# Patient Record
Sex: Male | Born: 1935
Health system: Southern US, Community
[De-identification: ages and names within clinical notes are randomized; demographics above are authoritative.]

## PROBLEM LIST (undated history)

## (undated) DIAGNOSIS — H3530 Unspecified macular degeneration: Secondary | ICD-10-CM

## (undated) DIAGNOSIS — E782 Mixed hyperlipidemia: Secondary | ICD-10-CM

## (undated) DIAGNOSIS — Q678 Other congenital deformities of chest: Secondary | ICD-10-CM

## (undated) DIAGNOSIS — I959 Hypotension, unspecified: Secondary | ICD-10-CM

## (undated) DIAGNOSIS — S0001XA Abrasion of scalp, initial encounter: Secondary | ICD-10-CM

## (undated) DIAGNOSIS — Z8701 Personal history of pneumonia (recurrent): Secondary | ICD-10-CM

## (undated) DIAGNOSIS — E039 Hypothyroidism, unspecified: Secondary | ICD-10-CM

## (undated) DIAGNOSIS — R413 Other amnesia: Secondary | ICD-10-CM

## (undated) DIAGNOSIS — F0390 Unspecified dementia without behavioral disturbance: Secondary | ICD-10-CM

## (undated) DIAGNOSIS — N400 Enlarged prostate without lower urinary tract symptoms: Secondary | ICD-10-CM

## (undated) DIAGNOSIS — R400 Somnolence: Secondary | ICD-10-CM

## (undated) DIAGNOSIS — E079 Disorder of thyroid, unspecified: Secondary | ICD-10-CM

## (undated) DIAGNOSIS — M40203 Unspecified kyphosis, cervicothoracic region: Secondary | ICD-10-CM

## (undated) DIAGNOSIS — M85839 Other specified disorders of bone density and structure, unspecified forearm: Secondary | ICD-10-CM

## (undated) DIAGNOSIS — R296 Repeated falls: Secondary | ICD-10-CM

## (undated) DIAGNOSIS — K0510 Chronic gingivitis, plaque induced: Secondary | ICD-10-CM

## (undated) DIAGNOSIS — K635 Polyp of colon: Secondary | ICD-10-CM

## (undated) DIAGNOSIS — K219 Gastro-esophageal reflux disease without esophagitis: Secondary | ICD-10-CM

## (undated) DIAGNOSIS — IMO0002 Reserved for concepts with insufficient information to code with codable children: Secondary | ICD-10-CM

## (undated) DIAGNOSIS — J309 Allergic rhinitis, unspecified: Secondary | ICD-10-CM

## (undated) DIAGNOSIS — Z8711 Personal history of peptic ulcer disease: Secondary | ICD-10-CM

## (undated) DIAGNOSIS — N39490 Overflow incontinence: Secondary | ICD-10-CM

## (undated) DIAGNOSIS — H353190 Nonexudative age-related macular degeneration, unspecified eye, stage unspecified: Secondary | ICD-10-CM

## (undated) DIAGNOSIS — Z860100 Personal history of colon polyps, unspecified: Secondary | ICD-10-CM

## (undated) DIAGNOSIS — H1010 Acute atopic conjunctivitis, unspecified eye: Secondary | ICD-10-CM

## (undated) DIAGNOSIS — C439 Malignant melanoma of skin, unspecified: Secondary | ICD-10-CM

## (undated) DIAGNOSIS — E785 Hyperlipidemia, unspecified: Secondary | ICD-10-CM

## (undated) DIAGNOSIS — IMO0001 Reserved for inherently not codable concepts without codable children: Secondary | ICD-10-CM

## (undated) HISTORY — DX: Allergic rhinitis, unspecified: J30.9

## (undated) HISTORY — DX: Gastro-esophageal reflux disease without esophagitis: K21.9

## (undated) HISTORY — DX: Chronic gingivitis, plaque induced: K05.10

## (undated) HISTORY — DX: Personal history of peptic ulcer disease: Z87.11

## (undated) HISTORY — DX: Hypothyroidism, unspecified: E03.9

## (undated) HISTORY — DX: Benign prostatic hyperplasia without lower urinary tract symptoms: N40.0

## (undated) HISTORY — DX: Overflow incontinence: N39.490

## (undated) HISTORY — DX: Reserved for inherently not codable concepts without codable children: IMO0001

## (undated) HISTORY — PX: MELANOMA EXCISION: SHX5266

## (undated) HISTORY — PX: CHOLECYSTECTOMY: SHX55

## (undated) HISTORY — DX: Other congenital deformities of chest: Q67.8

## (undated) HISTORY — DX: Repeated falls: R29.6

## (undated) HISTORY — PX: APPENDECTOMY: SHX54

## (undated) HISTORY — DX: Personal history of pneumonia (recurrent): Z87.01

## (undated) HISTORY — DX: Unspecified kyphosis, cervicothoracic region: M40.203

## (undated) HISTORY — DX: Unspecified dementia, unspecified severity, without behavioral disturbance, psychotic disturbance, mood disturbance, and anxiety: F03.90

## (undated) HISTORY — DX: Hypotension, unspecified: I95.9

## (undated) HISTORY — DX: Other specified disorders of bone density and structure, unspecified forearm: M85.839

## (undated) HISTORY — DX: Acute atopic conjunctivitis, unspecified eye: H10.10

## (undated) HISTORY — DX: Reserved for concepts with insufficient information to code with codable children: IMO0002

## (undated) HISTORY — DX: Personal history of colon polyps, unspecified: Z86.0100

## (undated) HISTORY — DX: Mixed hyperlipidemia: E78.2

## (undated) HISTORY — DX: Polyp of colon: K63.5

## (undated) HISTORY — DX: Hyperlipidemia, unspecified: E78.5

## (undated) HISTORY — PX: TONSILLECTOMY AND ADENOIDECTOMY: SUR1326

## (undated) HISTORY — DX: Personal history of colonic polyps: Z86.010

## (undated) HISTORY — DX: Abrasion of scalp, initial encounter: S00.01XA

## (undated) HISTORY — DX: Unspecified macular degeneration: H35.30

## (undated) HISTORY — DX: Somnolence: R40.0

## (undated) HISTORY — DX: Other amnesia: R41.3

## (undated) HISTORY — DX: Nonexudative age-related macular degeneration, unspecified eye, stage unspecified: H35.3190

---
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
## 1998-06-23 ENCOUNTER — Other Ambulatory Visit: Admission: RE | Admit: 1998-06-23 | Discharge: 1998-06-23 | Payer: Self-pay | Admitting: Family Medicine

## 1998-11-08 ENCOUNTER — Emergency Department (HOSPITAL_COMMUNITY): Admission: EM | Admit: 1998-11-08 | Discharge: 1998-11-08 | Payer: Self-pay | Admitting: Emergency Medicine

## 1998-11-08 ENCOUNTER — Encounter: Payer: Self-pay | Admitting: Emergency Medicine

## 1998-12-23 DIAGNOSIS — C439 Malignant melanoma of skin, unspecified: Secondary | ICD-10-CM

## 1998-12-23 HISTORY — PX: KNEE ARTHROSCOPY: SUR90

## 1998-12-23 HISTORY — DX: Malignant melanoma of skin, unspecified: C43.9

## 2001-06-08 ENCOUNTER — Encounter: Admission: RE | Admit: 2001-06-08 | Discharge: 2001-06-08 | Payer: Self-pay | Admitting: Orthopaedic Surgery

## 2001-06-08 ENCOUNTER — Encounter: Payer: Self-pay | Admitting: Orthopaedic Surgery

## 2001-06-17 ENCOUNTER — Encounter: Admission: RE | Admit: 2001-06-17 | Discharge: 2001-07-03 | Payer: Self-pay | Admitting: Orthopedic Surgery

## 2001-10-08 DIAGNOSIS — C4491 Basal cell carcinoma of skin, unspecified: Secondary | ICD-10-CM

## 2001-10-08 HISTORY — DX: Basal cell carcinoma of skin, unspecified: C44.91

## 2001-12-23 DIAGNOSIS — K635 Polyp of colon: Secondary | ICD-10-CM

## 2001-12-23 HISTORY — DX: Polyp of colon: K63.5

## 2002-05-09 ENCOUNTER — Emergency Department (HOSPITAL_COMMUNITY): Admission: EM | Admit: 2002-05-09 | Discharge: 2002-05-09 | Payer: Self-pay | Admitting: Emergency Medicine

## 2002-09-02 ENCOUNTER — Encounter (INDEPENDENT_AMBULATORY_CARE_PROVIDER_SITE_OTHER): Payer: Self-pay | Admitting: *Deleted

## 2002-09-02 ENCOUNTER — Ambulatory Visit (HOSPITAL_COMMUNITY): Admission: RE | Admit: 2002-09-02 | Discharge: 2002-09-02 | Payer: Self-pay | Admitting: Gastroenterology

## 2003-07-18 DIAGNOSIS — C4492 Squamous cell carcinoma of skin, unspecified: Secondary | ICD-10-CM

## 2003-07-18 HISTORY — DX: Squamous cell carcinoma of skin, unspecified: C44.92

## 2004-09-03 ENCOUNTER — Emergency Department (HOSPITAL_COMMUNITY): Admission: EM | Admit: 2004-09-03 | Discharge: 2004-09-03 | Payer: Self-pay | Admitting: Emergency Medicine

## 2005-12-05 ENCOUNTER — Ambulatory Visit: Payer: Self-pay | Admitting: Critical Care Medicine

## 2005-12-05 ENCOUNTER — Inpatient Hospital Stay (HOSPITAL_COMMUNITY): Admission: EM | Admit: 2005-12-05 | Discharge: 2005-12-05 | Payer: Self-pay | Admitting: Emergency Medicine

## 2005-12-11 ENCOUNTER — Encounter: Admission: RE | Admit: 2005-12-11 | Discharge: 2005-12-11 | Payer: Self-pay | Admitting: Thoracic Surgery

## 2008-07-21 ENCOUNTER — Ambulatory Visit (HOSPITAL_COMMUNITY): Admission: RE | Admit: 2008-07-21 | Discharge: 2008-07-21 | Payer: Self-pay | Admitting: Surgical Oncology

## 2009-04-13 DIAGNOSIS — C4492 Squamous cell carcinoma of skin, unspecified: Secondary | ICD-10-CM

## 2009-04-13 HISTORY — DX: Squamous cell carcinoma of skin, unspecified: C44.92

## 2010-12-23 HISTORY — PX: INGUINAL HERNIA REPAIR: SHX194

## 2011-05-10 NOTE — Op Note (Signed)
NAME:  AKIO, HUDNALL            ACCOUNT NO.:  [id number]   MEDICAL RECORD NO.:  [id number]          PATIENT TYPE:  EMS   LOCATION:  MAJO                         FACILITY:  [facility]   PHYSICIAN:  Ines Bloomer, M.D. DATE OF BIRTH:  01-Jul-1926   DATE OF PROCEDURE:  12/04/2005  DATE OF DISCHARGE:                                 OPERATIVE REPORT   PREOPERATIVE DIAGNOSIS:  Aspirated dental implant to the right lower lobe.   POSTOPERATIVE DIAGNOSIS:  Aspirated dental implant to the right lower lobe.   OPERATION:  Video bronchoscope with retrieval of foreign body.   SURGEON:  Ines Bloomer, M.D.   ANESTHESIA:  General anesthesia.   INDICATION:  This 77 year old patient was undergoing a dental implant and  aspirated the implant.  The implant was seen on x-ray to be in the right  lower lobe.  Dr. Sung Amabile attempted to remove it with bronchoscopy, but it  was impacted in the right lower lobe.   DESCRIPTION OF PROCEDURE:  He was brought to the operating room and  underwent general anesthesia.  The Dum[place] and the video bronchoscope were  prepared.  The video bronchoscope was used to pass through the endotracheal  tube and when we got through it, we could see the tooth in the right lower  lobe; it was markedly impacted.  We could not get a large or the small snare  around it, but we finally passed a #5 Fogarty catheter around it and was  able to dislodge it by blowing the balloon up distally and then pulling the  tooth out.  The tooth was then brought out and actually went over to the  left main stem bronchus.  When in the left main stem bronchus, we were able  to get the snare around it and then pull the tooth up to the tip of the  endotracheal tube, but it was too big to go through an 8.5 endotracheal  tube, so we removed the endotracheal tube, bronchoscope and the snare all in  1 motion and we reinstrumented the patient with a #8 endotracheal tube.  The  tooth was removed and because  it was a dental implant, it was given back to  the wife.  Re-bronchoscopy was done and it showed a lot of erythema in the  right lower lobe.  Then the patient was awakened and returned to the  recovery room in stable condition.           ______________________________  Ines Bloomer, M.D.     DPB/MEDQ  D:  12/04/2005  T:  12/06/2005  Job:  [id number]   cc:   Oley Balm. Sung Amabile, M.D. [facility]  [address]  [place]  [place] [address]

## 2011-05-10 NOTE — Op Note (Signed)
NAMEKIJANA, CROMIE            ACCOUNT NO.:  [id number]   MEDICAL RECORD NO.:  [id number]          PATIENT TYPE:  INP   LOCATION:  6733                         FACILITY:  [facility]   PHYSICIAN:  Oley Balm. Sung Amabile, M.D. [facility] OF BIRTH:  [birth year]   DATE OF PROCEDURE:  12/04/2005  DATE OF DISCHARGE:  12/05/2005                                 OPERATIVE REPORT   PROCEDURE:  Bronchoscopy.   PULMONOLOGIST:  Oley Balm. Sung Amabile, M.D.   INDICATIONS:  Foreign body aspiration.   SEDATION:  Versed 6 mg IV, fentanyl 50 mcg IV.   ANESTHESIA:  Topical anesthesia was applied to the throat and 45 cc of 1%  lidocaine were used during the course the procedure.   DESCRIPTION OF PROCEDURE:  After adequate sedation and anesthesia, the  bronchoscope was introduced using an oral O approach.  Cords were well  visualized and anesthetized.  They moved symmetrically.  The scope was  advanced in the trachea.  A cursory airway examination was performed  demonstrating normal segmental anatomy except for the following.  At the  takeoff of the right lower lobe bronchus, an aspirated dental implant was  longitudinal.  This was partially compressing the right middle lobe  bronchus.  There was some mild surrounding inflammatory change.  After  completion of the airway examination, the attempt at removal the foreign  body were undertaken using a snare net, prongs and biopsy forceps. The  dental implant could not be dislodged or removed.  After approximately 30 or  more minutes of effort, the bronchoscopy was discontinued.   IMPRESSION:  Unsuccessful attempt at removal of foreign body.   PLAN:  I have consulted Dr. Edwyna Shell of thoracic surgery.  I think this needs  to be removed in the OR, perhaps through a rigid bronchoscopy.  There make  that perhaps through rigid bronchoscopy.           ______________________________  Oley Balm. Sung Amabile, M.D. [facility]     DBS/MEDQ  D:  12/04/2005  T:  12/05/2005  Job:   [id number]   cc:   Dellis Anes. Idell Pickles, M.D.  Fax: [phone]   Gerome Apley, D.D.S.

## 2011-05-10 NOTE — Consult Note (Signed)
NAMETHOMSON, HERBERS            ACCOUNT NO.:  [id number]   MEDICAL RECORD NO.:  [id number]          PATIENT TYPE:  INP   LOCATION:  6733                         FACILITY:  [facility]   PHYSICIAN:  Oley Balm. Sung Amabile, M.D. Romualdo Bolk OF BIRTH:  Mar 05, 1926   DATE OF CONSULTATION:  12/04/2005  DATE OF DISCHARGE:  12/05/2005                                   CONSULTATION   REQUESTING PHYSICIAN:  Dr. Linwood Dibbles.   REASON FOR CONSULTATION:  Foreign body aspiration.   HISTORY OF PRESENT ILLNESS:  [name] is a 77 year old gentleman who is  remarkably healthy. He is undergoing dental work today and after a dental  implant was placed the hygienist was flossing. This dislodged the implant  and he aspirated the implant. He was then sent to the emergency department  where chest x-ray confirmed that this foreign body was lodged in his right  middle lobe. At the present time, he complains of a vague discomfort in his  chest but with minimal cough and no dyspnea or other respiratory symptoms.   PAST MEDICAL HISTORY:  Otherwise unremarkable.   SOCIAL HISTORY:  Never smoked.   FAMILY HISTORY:  Noncontributory.   REVIEW OF SYSTEMS:  Otherwise negative.   PHYSICAL EXAMINATION:  He is in no acute cardiac or respiratory distress.  Head and neck exam without acute abnormalities. Chest examination reveals a  normal percussion throughout. Breath sounds are full and coarse with  scattered wheezes predominately on the right. Cardiac exam reveals regular  rate and rhythm. No murmurs. Abdomen is soft and nontender with normal bowel  sounds. Extremities are without clubbing, cyanosis or edema. Neurologic exam  is without focal deficit.   DATA:  Chest x-ray reveals a metallic foreign body in the right mainstem  bronchus. Otherwise no acute cardiac or pulmonary disease.   IMPRESSION:  Foreign body aspiration (dental implant). We have been unable  to dislodge this foreign body with positioning and cough.  Therefore, we will  need to proceed with bronchoscopy. If unable to remove this with the  fiberoptic bronchoscopy, this might require procedure in the operating room.   PLAN:  To bronchoscopy suite for foreign body removal of a dental implants  in the right main stem bronchus.           ______________________________  Oley Balm. Sung Amabile, M.D. [facility]     DBS/MEDQ  D:  12/04/2005  T:  12/05/2005  Job:  [id number]   cc:   Dellis Anes. Idell Pickles, M.D.  Fax: [phone]   Gerome Apley, M.D.

## 2011-08-05 ENCOUNTER — Other Ambulatory Visit: Payer: Self-pay

## 2011-09-20 LAB — GLUCOSE, CAPILLARY: Glucose-Capillary: 99

## 2012-01-06 DIAGNOSIS — Z125 Encounter for screening for malignant neoplasm of prostate: Secondary | ICD-10-CM | POA: Diagnosis not present

## 2012-01-06 DIAGNOSIS — E039 Hypothyroidism, unspecified: Secondary | ICD-10-CM | POA: Diagnosis not present

## 2012-01-06 DIAGNOSIS — Z79899 Other long term (current) drug therapy: Secondary | ICD-10-CM | POA: Diagnosis not present

## 2012-01-06 DIAGNOSIS — E7800 Pure hypercholesterolemia, unspecified: Secondary | ICD-10-CM | POA: Diagnosis not present

## 2012-01-07 DIAGNOSIS — K277 Chronic peptic ulcer, site unspecified, without hemorrhage or perforation: Secondary | ICD-10-CM | POA: Diagnosis not present

## 2012-01-07 DIAGNOSIS — E039 Hypothyroidism, unspecified: Secondary | ICD-10-CM | POA: Diagnosis not present

## 2012-01-07 DIAGNOSIS — Z79899 Other long term (current) drug therapy: Secondary | ICD-10-CM | POA: Diagnosis not present

## 2012-01-07 DIAGNOSIS — C439 Malignant melanoma of skin, unspecified: Secondary | ICD-10-CM | POA: Diagnosis not present

## 2012-01-07 DIAGNOSIS — Z86010 Personal history of colonic polyps: Secondary | ICD-10-CM | POA: Diagnosis not present

## 2012-01-07 DIAGNOSIS — N400 Enlarged prostate without lower urinary tract symptoms: Secondary | ICD-10-CM | POA: Diagnosis not present

## 2012-01-07 DIAGNOSIS — Z0000 Encounter for general adult medical examination without abnormal findings: Secondary | ICD-10-CM | POA: Diagnosis not present

## 2012-01-07 DIAGNOSIS — E782 Mixed hyperlipidemia: Secondary | ICD-10-CM | POA: Diagnosis not present

## 2012-01-14 DIAGNOSIS — D239 Other benign neoplasm of skin, unspecified: Secondary | ICD-10-CM | POA: Diagnosis not present

## 2012-01-14 DIAGNOSIS — L851 Acquired keratosis [keratoderma] palmaris et plantaris: Secondary | ICD-10-CM | POA: Diagnosis not present

## 2012-02-04 DIAGNOSIS — H6690 Otitis media, unspecified, unspecified ear: Secondary | ICD-10-CM | POA: Diagnosis not present

## 2012-02-04 DIAGNOSIS — H9190 Unspecified hearing loss, unspecified ear: Secondary | ICD-10-CM | POA: Diagnosis not present

## 2012-02-04 DIAGNOSIS — H6980 Other specified disorders of Eustachian tube, unspecified ear: Secondary | ICD-10-CM | POA: Diagnosis not present

## 2012-02-10 DIAGNOSIS — T189XXA Foreign body of alimentary tract, part unspecified, initial encounter: Secondary | ICD-10-CM | POA: Diagnosis not present

## 2012-02-10 DIAGNOSIS — R059 Cough, unspecified: Secondary | ICD-10-CM | POA: Diagnosis not present

## 2012-02-10 DIAGNOSIS — R05 Cough: Secondary | ICD-10-CM | POA: Diagnosis not present

## 2012-03-02 DIAGNOSIS — H6120 Impacted cerumen, unspecified ear: Secondary | ICD-10-CM | POA: Diagnosis not present

## 2012-03-02 DIAGNOSIS — H6980 Other specified disorders of Eustachian tube, unspecified ear: Secondary | ICD-10-CM | POA: Diagnosis not present

## 2012-03-02 DIAGNOSIS — H9190 Unspecified hearing loss, unspecified ear: Secondary | ICD-10-CM | POA: Diagnosis not present

## 2012-04-27 DIAGNOSIS — L570 Actinic keratosis: Secondary | ICD-10-CM | POA: Diagnosis not present

## 2012-07-22 DIAGNOSIS — R059 Cough, unspecified: Secondary | ICD-10-CM | POA: Diagnosis not present

## 2012-07-22 DIAGNOSIS — R05 Cough: Secondary | ICD-10-CM | POA: Diagnosis not present

## 2012-07-24 DIAGNOSIS — C4360 Malignant melanoma of unspecified upper limb, including shoulder: Secondary | ICD-10-CM | POA: Diagnosis not present

## 2012-07-24 DIAGNOSIS — Z85820 Personal history of malignant melanoma of skin: Secondary | ICD-10-CM | POA: Diagnosis not present

## 2012-07-25 ENCOUNTER — Emergency Department (HOSPITAL_COMMUNITY)
Admission: EM | Admit: 2012-07-25 | Discharge: 2012-07-25 | Disposition: A | Discharge status: Home / Self Care | Payer: Medicare Other | Attending: Emergency Medicine | Admitting: Emergency Medicine

## 2012-07-25 ENCOUNTER — Emergency Department (HOSPITAL_COMMUNITY)
Admission: EM | Admit: 2012-07-25 | Discharge: 2012-07-25 | Discharge status: Against Medical Advice | Payer: Self-pay | Attending: Emergency Medicine | Admitting: Emergency Medicine

## 2012-07-25 ENCOUNTER — Encounter (HOSPITAL_COMMUNITY): Payer: Self-pay | Admitting: Emergency Medicine

## 2012-07-25 DIAGNOSIS — R21 Rash and other nonspecific skin eruption: Secondary | ICD-10-CM | POA: Insufficient documentation

## 2012-07-25 DIAGNOSIS — Z85820 Personal history of malignant melanoma of skin: Secondary | ICD-10-CM | POA: Diagnosis not present

## 2012-07-25 DIAGNOSIS — E079 Disorder of thyroid, unspecified: Secondary | ICD-10-CM | POA: Diagnosis not present

## 2012-07-25 DIAGNOSIS — L259 Unspecified contact dermatitis, unspecified cause: Secondary | ICD-10-CM | POA: Diagnosis not present

## 2012-07-25 DIAGNOSIS — Z9089 Acquired absence of other organs: Secondary | ICD-10-CM | POA: Diagnosis not present

## 2012-07-25 HISTORY — DX: Disorder of thyroid, unspecified: E07.9

## 2012-07-25 HISTORY — DX: Malignant melanoma of skin, unspecified: C43.9

## 2012-07-25 MED ORDER — PREDNISONE (PAK) 10 MG PO TABS: ORAL_TABLET | ORAL | Status: AC

## 2012-07-25 MED ORDER — PREDNISONE 20 MG PO TABS
60.0000 mg | ORAL_TABLET | Freq: Once | ORAL | Status: AC
  Administered 2012-07-25: 60 mg via ORAL
  Filled 2012-07-25: qty 3

## 2012-07-25 MED ORDER — DIPHENHYDRAMINE HCL 25 MG PO TABS: 25.0000 mg | ORAL_TABLET | Freq: Four times a day (QID) | ORAL | Status: DC

## 2012-07-25 NOTE — ED Provider Notes (Signed)
History     CSN: [id number]  Arrival date & time 07/25/12  0734   First MD Initiated Contact with Patient 07/25/12 [phone]      Chief Complaint  Patient presents with  . rash to face      HPI Patient presents to the emergency room with complaints of  rash to his face and his forearms.  It started on Wednesday.  He had started a new medication, Mucinex, just before it started and also had been working out in the yard with pine needles a few days before that.  It is not itching too much.  NO pain or fevers.  Otherwise he feels fine.  This am the rash is worse and is now involving his face.  He denies trouble with his breathing, speaking or swallowing. Past Medical History  Diagnosis Date  . Melanoma     treated at [facility]  . Thyroid disease     Past Surgical History  Procedure Date  . Appendectomy   . Tonsillectomy   . Cholecystectomy     History reviewed. No pertinent family history.  History  Substance Use Topics  . Smoking status: Never Smoker   . Smokeless tobacco: Not on file  . Alcohol Use: No      Review of Systems  All other systems reviewed and are negative.    Allergies  Review of patient's allergies indicates no known allergies.  Home Medications   Current Outpatient Rx  Name Route Sig Dispense Refill  . DIPHENHYDRAMINE HCL 25 MG PO TABS Oral Take 1 tablet (25 mg total) by mouth every 6 (six) hours. 20 tablet 0  . PREDNISONE (PAK) 10 MG PO TABS  Take 6 tabs by mouth daily  for 1 days, then 5 tabs for 1 days, then 4 tabs for 1 days, then 3 tabs for 1 days, 2 tabs for 1 days, then 1 tab by mouth daily for 1 days 21 tablet 0    BP 131/94  Pulse 94  Temp 98.3 F (36.8 C) (Oral)  Resp 16  SpO2 94%  Physical Exam  Nursing note and vitals reviewed. Constitutional: He appears well-developed and well-nourished. No distress.  HENT:  Head: Normocephalic and atraumatic.  Right Ear: External ear normal.  Left Ear: External ear normal.  Eyes: Conjunctivae  are normal. Right eye exhibits no discharge. Left eye exhibits no discharge. No scleral icterus.  Neck: Neck supple. No tracheal deviation present.  Cardiovascular: Normal rate and regular rhythm.   Pulmonary/Chest: Effort normal and breath sounds normal. No stridor. No respiratory distress.  Musculoskeletal: He exhibits no edema.  Neurological: He is alert. Cranial nerve deficit: no gross deficits.  Skin: Skin is warm and dry. Rash noted. No petechiae noted. Rash is macular. Rash is not pustular.       eythematous rash on forearm and face, mild edema around the rash, clear fluid blister lesions on forearms, irregular shape,   Psychiatric: He has a normal mood and affect.    ED Course  Procedures (including critical care time)  Labs Reviewed - No data to display No results found.   1. Contact dermatitis       MDM  Consistent with a contact dermatitis.  ?poison ivy.  Pt had been working in yard.  Will dc home on steroid taper.  Continue antihistamine.  No sign of respiratory symptoms, angioedema.        Celene Kras, MD 07/25/12 [phone]

## 2012-07-25 NOTE — ED Notes (Addendum)
Rash on face, both forearms- started Thursday, worse today- was seen at [facility] yesterday for Cancer check, was told to see dermatologist, couldn't get an appt until next week. Said took [employer] on Wednesday   Hx malignant melanoma

## 2012-07-25 NOTE — ED Notes (Signed)
Rash to face and forearms started Wednesday after taking Mucinex  For cold sx. Was seen at [facility] yesterday for Cancer check-- hx malignant melanoma- was told to go to dermatologist- no appt available until next week. Woke up with face more red. No itching, blisters noted on forearms.

## 2012-07-27 DIAGNOSIS — L259 Unspecified contact dermatitis, unspecified cause: Secondary | ICD-10-CM | POA: Diagnosis not present

## 2012-09-07 DIAGNOSIS — L259 Unspecified contact dermatitis, unspecified cause: Secondary | ICD-10-CM | POA: Diagnosis not present

## 2012-09-07 DIAGNOSIS — L570 Actinic keratosis: Secondary | ICD-10-CM | POA: Diagnosis not present

## 2012-09-16 DIAGNOSIS — Z23 Encounter for immunization: Secondary | ICD-10-CM | POA: Diagnosis not present

## 2013-01-07 DIAGNOSIS — C439 Malignant melanoma of skin, unspecified: Secondary | ICD-10-CM | POA: Diagnosis not present

## 2013-01-07 DIAGNOSIS — Z125 Encounter for screening for malignant neoplasm of prostate: Secondary | ICD-10-CM | POA: Diagnosis not present

## 2013-01-07 DIAGNOSIS — E782 Mixed hyperlipidemia: Secondary | ICD-10-CM | POA: Diagnosis not present

## 2013-01-07 DIAGNOSIS — Z79899 Other long term (current) drug therapy: Secondary | ICD-10-CM | POA: Diagnosis not present

## 2013-01-07 DIAGNOSIS — Z0000 Encounter for general adult medical examination without abnormal findings: Secondary | ICD-10-CM | POA: Diagnosis not present

## 2013-01-07 DIAGNOSIS — Z86010 Personal history of colonic polyps: Secondary | ICD-10-CM | POA: Diagnosis not present

## 2013-01-07 DIAGNOSIS — E039 Hypothyroidism, unspecified: Secondary | ICD-10-CM | POA: Diagnosis not present

## 2013-01-07 DIAGNOSIS — Z1331 Encounter for screening for depression: Secondary | ICD-10-CM | POA: Diagnosis not present

## 2013-02-02 DIAGNOSIS — L570 Actinic keratosis: Secondary | ICD-10-CM | POA: Diagnosis not present

## 2013-02-27 ENCOUNTER — Encounter (HOSPITAL_COMMUNITY): Payer: Self-pay | Admitting: Emergency Medicine

## 2013-02-27 ENCOUNTER — Observation Stay (HOSPITAL_COMMUNITY): Payer: Medicare Other

## 2013-02-27 ENCOUNTER — Emergency Department (HOSPITAL_COMMUNITY): Payer: Medicare Other

## 2013-02-27 ENCOUNTER — Observation Stay (HOSPITAL_COMMUNITY)
Admission: EM | Admit: 2013-02-27 | Discharge: 2013-03-01 | Disposition: A | Discharge status: Home / Self Care | Payer: Medicare Other | Attending: Internal Medicine | Admitting: Internal Medicine

## 2013-02-27 DIAGNOSIS — M25579 Pain in unspecified ankle and joints of unspecified foot: Secondary | ICD-10-CM | POA: Diagnosis not present

## 2013-02-27 DIAGNOSIS — R55 Syncope and collapse: Principal | ICD-10-CM

## 2013-02-27 DIAGNOSIS — S92919A Unspecified fracture of unspecified toe(s), initial encounter for closed fracture: Secondary | ICD-10-CM

## 2013-02-27 DIAGNOSIS — J310 Chronic rhinitis: Secondary | ICD-10-CM | POA: Insufficient documentation

## 2013-02-27 DIAGNOSIS — E039 Hypothyroidism, unspecified: Secondary | ICD-10-CM

## 2013-02-27 DIAGNOSIS — J302 Other seasonal allergic rhinitis: Secondary | ICD-10-CM

## 2013-02-27 DIAGNOSIS — M7989 Other specified soft tissue disorders: Secondary | ICD-10-CM | POA: Diagnosis not present

## 2013-02-27 DIAGNOSIS — S99919A Unspecified injury of unspecified ankle, initial encounter: Secondary | ICD-10-CM | POA: Diagnosis not present

## 2013-02-27 DIAGNOSIS — S8990XA Unspecified injury of unspecified lower leg, initial encounter: Secondary | ICD-10-CM | POA: Diagnosis not present

## 2013-02-27 DIAGNOSIS — W19XXXA Unspecified fall, initial encounter: Secondary | ICD-10-CM

## 2013-02-27 DIAGNOSIS — S298XXA Other specified injuries of thorax, initial encounter: Secondary | ICD-10-CM | POA: Diagnosis not present

## 2013-02-27 DIAGNOSIS — R404 Transient alteration of awareness: Secondary | ICD-10-CM | POA: Diagnosis not present

## 2013-02-27 DIAGNOSIS — S93409A Sprain of unspecified ligament of unspecified ankle, initial encounter: Secondary | ICD-10-CM | POA: Insufficient documentation

## 2013-02-27 DIAGNOSIS — S92401A Displaced unspecified fracture of right great toe, initial encounter for closed fracture: Secondary | ICD-10-CM

## 2013-02-27 DIAGNOSIS — J029 Acute pharyngitis, unspecified: Secondary | ICD-10-CM

## 2013-02-27 DIAGNOSIS — S93401A Sprain of unspecified ligament of right ankle, initial encounter: Secondary | ICD-10-CM

## 2013-02-27 LAB — COMPREHENSIVE METABOLIC PANEL
ALT: 15 U/L (ref 0–53)
AST: 22 U/L (ref 0–37)
Albumin: 3.7 g/dL (ref 3.5–5.2)
Alkaline Phosphatase: 77 U/L (ref 39–117)
BUN: 24 mg/dL — ABNORMAL HIGH (ref 6–23)
CO2: 28 mEq/L (ref 19–32)
Calcium: 9.5 mg/dL (ref 8.4–10.5)
Chloride: 101 mEq/L (ref 96–112)
Creatinine, Ser: 1.13 mg/dL (ref 0.50–1.35)
GFR calc Af Amer: 67 mL/min — ABNORMAL LOW (ref >90)
GFR calc non Af Amer: 58 mL/min — ABNORMAL LOW (ref >90)
Glucose, Bld: 116 mg/dL — ABNORMAL HIGH (ref 70–99)
Potassium: 4.5 mEq/L (ref 3.5–5.1)
Sodium: 137 mEq/L (ref 135–145)
Total Bilirubin: 0.4 mg/dL (ref 0.3–1.2)
Total Protein: 7 g/dL (ref 6.0–8.3)

## 2013-02-27 LAB — CBC WITH DIFFERENTIAL/PLATELET
Basophils Absolute: 0 10*3/uL (ref 0.0–0.1)
Basophils Relative: 0 % (ref 0–1)
Eosinophils Absolute: 0 10*3/uL (ref 0.0–0.7)
Eosinophils Relative: 0 % (ref 0–5)
HCT: 40.1 % (ref 39.0–52.0)
Hemoglobin: 14.3 g/dL (ref 13.0–17.0)
Lymphocytes Relative: 3 % — ABNORMAL LOW (ref 12–46)
Lymphs Abs: 0.3 10*3/uL — ABNORMAL LOW (ref 0.7–4.0)
MCH: 32.9 pg (ref 26.0–34.0)
MCHC: 35.7 g/dL (ref 30.0–36.0)
MCV: 92.2 fL (ref 78.0–100.0)
Monocytes Absolute: 0.6 10*3/uL (ref 0.1–1.0)
Monocytes Relative: 5 % (ref 3–12)
Neutro Abs: 10.7 10*3/uL — ABNORMAL HIGH (ref 1.7–7.7)
Neutrophils Relative %: 92 % — ABNORMAL HIGH (ref 43–77)
Platelets: 228 10*3/uL (ref 150–400)
RBC: 4.35 MIL/uL (ref 4.22–5.81)
RDW: 12.5 % (ref 11.5–15.5)
WBC: 11.7 10*3/uL — ABNORMAL HIGH (ref 4.0–10.5)

## 2013-02-27 LAB — INFLUENZA PANEL BY PCR (TYPE A & B)
H1N1 flu by pcr: NOT DETECTED
Influenza A By PCR: NEGATIVE
Influenza B By PCR: NEGATIVE

## 2013-02-27 LAB — RAPID STREP SCREEN (MED CTR MEBANE ONLY): Streptococcus, Group A Screen (Direct): NEGATIVE

## 2013-02-27 LAB — POCT I-STAT TROPONIN I: Troponin i, poc: 0 ng/mL (ref 0.00–0.08)

## 2013-02-27 LAB — TROPONIN I: Troponin I: <0.3 ng/mL (ref <0.30)

## 2013-02-27 MED ORDER — ONDANSETRON HCL 4 MG/2ML IJ SOLN: 4.0000 mg | Freq: Four times a day (QID) | INTRAMUSCULAR | Status: DC | PRN

## 2013-02-27 MED ORDER — ASPIRIN EC 81 MG PO TBEC
81.0000 mg | DELAYED_RELEASE_TABLET | ORAL | Status: DC
  Filled 2013-02-27 (×2): qty 1

## 2013-02-27 MED ORDER — ENOXAPARIN SODIUM 40 MG/0.4ML ~~LOC~~ SOLN
40.0000 mg | SUBCUTANEOUS | Status: DC
  Administered 2013-02-27 – 2013-02-28 (×2): 40 mg via SUBCUTANEOUS
  Filled 2013-02-27 (×3): qty 0.4

## 2013-02-27 MED ORDER — LEVOTHYROXINE SODIUM 75 MCG PO TABS
75.0000 ug | ORAL_TABLET | Freq: Every day | ORAL | Status: DC
  Administered 2013-02-28 – 2013-03-01 (×2): 75 ug via ORAL
  Filled 2013-02-27 (×4): qty 1

## 2013-02-27 MED ORDER — VITAMIN C 500 MG PO TABS
500.0000 mg | ORAL_TABLET | Freq: Every day | ORAL | Status: DC
  Administered 2013-02-28 – 2013-03-01 (×2): 500 mg via ORAL
  Filled 2013-02-27 (×2): qty 1

## 2013-02-27 MED ORDER — LORATADINE 10 MG PO TABS
10.0000 mg | ORAL_TABLET | Freq: Every day | ORAL | Status: DC
  Administered 2013-02-27 – 2013-03-01 (×3): 10 mg via ORAL
  Filled 2013-02-27 (×4): qty 1

## 2013-02-27 MED ORDER — ONDANSETRON HCL 4 MG PO TABS: 4.0000 mg | ORAL_TABLET | Freq: Four times a day (QID) | ORAL | Status: DC | PRN

## 2013-02-27 MED ORDER — ADULT MULTIVITAMIN W/MINERALS CH
1.0000 | ORAL_TABLET | Freq: Every day | ORAL | Status: DC
  Administered 2013-02-28 – 2013-03-01 (×2): 1 via ORAL
  Filled 2013-02-27 (×2): qty 1

## 2013-02-27 MED ORDER — SODIUM CHLORIDE 0.9 % IV SOLN
INTRAVENOUS | Status: AC
  Administered 2013-02-27 (×2): via INTRAVENOUS

## 2013-02-27 MED ORDER — LORATADINE 10 MG PO TABS
10.0000 mg | ORAL_TABLET | Freq: Every day | ORAL | Status: DC
  Filled 2013-02-27: qty 1

## 2013-02-27 MED ORDER — HYDROCODONE-ACETAMINOPHEN 5-325 MG PO TABS: 0.5000 | ORAL_TABLET | Freq: Four times a day (QID) | ORAL | Status: DC | PRN

## 2013-02-27 MED ORDER — ACETAMINOPHEN 650 MG RE SUPP: 650.0000 mg | Freq: Four times a day (QID) | RECTAL | Status: DC | PRN

## 2013-02-27 MED ORDER — ACETAMINOPHEN 325 MG PO TABS: 650.0000 mg | ORAL_TABLET | Freq: Four times a day (QID) | ORAL | Status: DC | PRN

## 2013-02-27 NOTE — Progress Notes (Signed)
Orthopedic Tech Progress Note Patient Details:  Dominic Garcia [birth year] [id number]  Ortho Devices Type of Ortho Device: ASO Ortho Device/Splint Location: (R) LE Ortho Device/Splint Interventions: Application   [name] 02/27/2013, 3:54 PM

## 2013-02-27 NOTE — ED Provider Notes (Addendum)
History     CSN: [id number]  Arrival date & time 02/27/13  1415   First MD Initiated Contact with Patient 02/27/13 1436      Chief Complaint  Patient presents with  . Loss of Consciousness    (Consider location/radiation/quality/duration/timing/severity/associated sxs/prior treatment) HPI Complaint of syncopal event onset this morning. Patient had no prodrome. He injured his right ankle and right great toe as result of fall. Denies chest pain or shortness of breath. He admits to nasal congestion for the past several days due to "allergies." And slight sore throat No treatment prior to coming here. Presently asymptomatic except for pain at right great toe and right ankle Past Medical History  Diagnosis Date  . Melanoma     treated at [facility]  . Thyroid disease     Past Surgical History  Procedure Laterality Date  . Appendectomy    . Tonsillectomy    . Cholecystectomy      No family history on file.  History  Substance Use Topics  . Smoking status: Never Smoker   . Smokeless tobacco: Not on file  . Alcohol Use: No      Review of Systems  Constitutional: Negative.   HENT: Negative.   Respiratory: Negative.   Cardiovascular:       Syncope  Gastrointestinal: Negative.   Musculoskeletal: Positive for joint swelling.       Pain and swelling at right great toe and right ankle  Skin: Negative.   Allergic/Immunologic: Negative.   Neurological: Negative.   Psychiatric/Behavioral: Negative.   All other systems reviewed and are negative.    Allergies  Review of patient's allergies indicates no known allergies.  Home Medications   Current Outpatient Rx  Name  Route  Sig  Dispense  Refill  . EXPIRED: diphenhydrAMINE (BENADRYL) 25 MG tablet   Oral   Take 1 tablet (25 mg total) by mouth every 6 (six) hours.   20 tablet   0   . Glucosamine-Chondroitin (GLUCOSAMINE CHONDR COMPLEX PO)   Oral   Take 1 tablet by mouth 2 (two) times daily.         Marland Kitchen guaiFENesin  (MUCINEX) 600 MG 12 hr tablet   Oral   Take 600 mg by mouth 2 (two) times daily as needed. For congestion.         Marland Kitchen levothyroxine (SYNTHROID, LEVOTHROID) 75 MCG tablet   Oral   Take 75 mcg by mouth daily.         . Multiple Vitamin (MULTIVITAMIN WITH MINERALS) TABS   Oral   Take 1 tablet by mouth daily.         . Multiple Vitamins-Minerals (ICAPS PO)   Oral   Take 2 capsules by mouth 2 (two) times daily.         . vitamin C (ASCORBIC ACID) 500 MG tablet   Oral   Take 500 mg by mouth daily.           BP 102/47  Pulse 88  Temp(Src) 98 F (36.7 C) (Oral)  Resp 16  SpO2 97%  Physical Exam  Nursing note and vitals reviewed. Constitutional: He appears well-developed and well-nourished.  HENT:  Head: Normocephalic and atraumatic.  Pharyngeal erythema, no tonsillar exudate or swelling, uvula midline  Eyes: Conjunctivae are normal. Pupils are equal, round, and reactive to light.  Neck: Neck supple. No tracheal deviation present. No thyromegaly present.  Cardiovascular: Normal rate and regular rhythm.   No murmur heard. Pulmonary/Chest: Effort normal and breath  sounds normal.  Abdominal: Soft. Bowel sounds are normal. He exhibits no distension. There is no tenderness.  Musculoskeletal: Normal range of motion. He exhibits edema and tenderness.  Right lower extremity skin intact tender and swollen over medial malleolus  great toe is tender, no swelling. No subungual hematoma. Negative Thompson's sign  Lymphadenopathy:    He has no cervical adenopathy.  Neurological: He is alert. Coordination normal.  Skin: Skin is warm and dry. No rash noted.  Psychiatric: He has a normal mood and affect.    ED Course  Procedures (including critical care time)  Labs Reviewed  CBC WITH DIFFERENTIAL  COMPREHENSIVE METABOLIC PANEL   No results found.   No diagnosis found.   Date: 02/27/2013  Rate: 85  Rhythm: normal sinus rhythm  QRS Axis: normal  Intervals: normal  ST/T  Wave abnormalities: normal  Conduction Disutrbances: none  Narrative Interpretation: unremarkable  No old tracing Results for orders placed during the hospital encounter of 02/27/13  CBC WITH DIFFERENTIAL      Result Value Range   WBC 11.7 (*) 4.0 - 10.5 K/uL   RBC 4.35  4.22 - 5.81 MIL/uL   Hemoglobin 14.3  13.0 - 17.0 g/dL   HCT 13.0  86.5 - 78.4 %   MCV 92.2  78.0 - 100.0 fL   MCH 32.9  26.0 - 34.0 pg   MCHC 35.7  30.0 - 36.0 g/dL   RDW 69.6  29.5 - 28.4 %   Platelets 228  150 - 400 K/uL   Neutrophils Relative 92 (*) 43 - 77 %   Neutro Abs 10.7 (*) 1.7 - 7.7 K/uL   Lymphocytes Relative 3 (*) 12 - 46 %   Lymphs Abs 0.3 (*) 0.7 - 4.0 K/uL   Monocytes Relative 5  3 - 12 %   Monocytes Absolute 0.6  0.1 - 1.0 K/uL   Eosinophils Relative 0  0 - 5 %   Eosinophils Absolute 0.0  0.0 - 0.7 K/uL   Basophils Relative 0  0 - 1 %   Basophils Absolute 0.0  0.0 - 0.1 K/uL  COMPREHENSIVE METABOLIC PANEL      Result Value Range   Sodium 137  135 - 145 mEq/L   Potassium 4.5  3.5 - 5.1 mEq/L   Chloride 101  96 - 112 mEq/L   CO2 28  19 - 32 mEq/L   Glucose, Bld 116 (*) 70 - 99 mg/dL   BUN 24 (*) 6 - 23 mg/dL   Creatinine, Ser 1.32  0.50 - 1.35 mg/dL   Calcium 9.5  8.4 - 44.0 mg/dL   Total Protein 7.0  6.0 - 8.3 g/dL   Albumin 3.7  3.5 - 5.2 g/dL   AST 22  0 - 37 U/L   ALT 15  0 - 53 U/L   Alkaline Phosphatase 77  39 - 117 U/L   Total Bilirubin 0.4  0.3 - 1.2 mg/dL   GFR calc non Af Amer 58 (*) >90 mL/min   GFR calc Af Amer 67 (*) >90 mL/min  POCT I-STAT TROPONIN I      Result Value Range   Troponin i, poc 0.00  0.00 - 0.08 ng/mL   Comment 3            Dg Chest 2 View  02/27/2013  *RADIOLOGY REPORT*  Clinical Data: Loss of consciousness.  Fall.History of melanoma.  CHEST - 2 VIEW  Comparison: 12/11/2005  Findings: Mild S-shaped thoracolumbar spine curvature.Normal heart size  and mediastinal contours. No pleural effusion or pneumothorax. Suspect mild scarring at the left lung base  versus osseous summation shadow.  IMPRESSION: No acute cardiopulmonary disease.   Original Report Authenticated By: Jeronimo Greaves, M.D.    Dg Ankle Complete Right  02/27/2013  *RADIOLOGY REPORT*  Clinical Data: Fall with ankle pain.  Lateral swelling.  RIGHT ANKLE - COMPLETE 3+ VIEW  Comparison: None.  Findings: Lateral malleolar soft tissue swelling is mild to moderate. No acute fracture or dislocation.  Base of fifth metatarsal and talar dome intact.  Mild tibiotalar osteoarthritis.  IMPRESSION: Lateral malleolar soft tissue swelling only.   Original Report Authenticated By: Jeronimo Greaves, M.D.    Dg Toe Great Right  02/27/2013  *RADIOLOGY REPORT*  Clinical Data: Fall with right great toe pain.  RIGHT GREAT TOE  Comparison: None.  Findings: Degenerative changes of the first metatarsal phalangeal joint.  An oblique fracture involves the lateral and plantar surface of the proximal most aspect of the distal phalanx of the great toe.  This is intra-articular, and minimally displaced.  IMPRESSION: Distal phalangeal fracture, great toe.   Original Report Authenticated By: Jeronimo Greaves, M.D.     X-rays reviewed by me Declines pain medicine At 3:50 PM patient alert Glasgow Coma Score 15  MDM    Spoke with Dr.Reddy plan 23 hour observation, telemetry Diagnosis #1 syncope #2 closed fracture of right great toe #3 right ankle sprain       Doug Sou, MD 02/27/13 1558  Doug Sou, MD 02/27/13 1614

## 2013-02-27 NOTE — ED Notes (Signed)
Onset today middle of night sneezing and sore throat.  Patient climbed a ladder clean a fan and felt lightheaded.  Came off ladder standing and witnessed by wife syncope event lasting couple of seconds.  Developed right ankle pain after syncope event.  Pain 3/10 achy dull.  Denies chest pain or shortness of breath. EMS was called BP was 80/40. Refused transport by EMS and brought by wife. Ax4 answering and following commands appropriate.

## 2013-02-27 NOTE — H&P (Signed)
Patient's PCP: Mickie Hillier, MD  Chief Complaint: Loss of consciousness  History of Present Illness: Dominic Garcia is a 79 y.o. Caucasian male with history of hypothyroidism and melanoma treated at [facility], who presents with the above complaints.  He reported that during the night at 2 a.m. he had sore throat and some sinus congestion, which he attributes to allergies.  Wife reports that they have recently changed their pillows to goose feather pillows.  This morning he reports he was feeling wobbly, denied dizziness or lightheadedness.  At 1130 a.m. he was helping his wife who was on the ladder, holding the ladder, while she was cleaning in the kitchen.  He lost consciousness and fell to the right side.  Prior to the fall he denies any dizziness or lightheadedness.  He denies having any symptoms prior to the fall.  Per wife he lost consciousness for about 2-3 minutes.  She believes that he may have struck his head.  She noted possible twitching of his leg.  He was not incontinent of bowel or bladder.  Eventually he regained consciousness, he then sat in a chair and again fell to his right side.  EMS was called and he presented to the ER for further evaluation.  Patient denies any recent fevers, chills, vomiting, chest pain, shortness of breath, abdominal pain, diarrhea, headaches or vision changes.  He does report feeling nauseated this morning.  Review of Systems: All systems reviewed with the patient and positive as per history of present illness, otherwise all other systems are negative.  Past Medical History  Diagnosis Date  . Melanoma     treated at [facility]  . Thyroid disease    Past Surgical History  Procedure Laterality Date  . Appendectomy    . Tonsillectomy    . Cholecystectomy     Family History  Problem Relation Age of Onset  . Renal Disease Father    History   Social History  . Marital Status: Married    Spouse Name: N/A    Number of Children: N/A  . Years of  Education: N/A   Occupational History  . Not on file.   Social History Main Topics  . Smoking status: Former Smoker    Quit date: 02/27/1953  . Smokeless tobacco: Not on file  . Alcohol Use: Yes     Comment: Wine, occasionally  . Drug Use: No  . Sexually Active: Not on file   Other Topics Concern  . Not on file   Social History Narrative  . No narrative on file   Allergies: Mucinex  Home Meds: Prior to Admission medications   Medication Sig Start Date End Date Taking? Authorizing Provider  aspirin EC 81 MG tablet Take 81 mg by mouth 3 (three) times a week.   Yes Historical Provider, MD  Glucosamine-Chondroitin (OSTEO BI-FLEX REGULAR STRENGTH) 250-200 MG TABS Take 1 capsule by mouth 2 (two) times daily.   Yes Historical Provider, MD  levothyroxine (SYNTHROID, LEVOTHROID) 75 MCG tablet Take 75 mcg by mouth daily.   Yes Historical Provider, MD  Multiple Vitamin (MULTIVITAMIN WITH MINERALS) TABS Take 1 tablet by mouth daily.   Yes Historical Provider, MD  Multiple Vitamins-Minerals (ICAPS PO) Take 1 capsule by mouth every morning.    Yes Historical Provider, MD  vitamin C (ASCORBIC ACID) 500 MG tablet Take 500 mg by mouth daily.   Yes Historical Provider, MD    Physical Exam: Blood pressure 102/47, pulse 88, temperature 98 F (36.7 C), temperature  source Oral, resp. rate 16, SpO2 97.00%. General: Awake, Oriented x3, No acute distress. HEENT: EOMI, Moist mucous membranes, some redness in the posterior oropharynx, some redness in the left nasal mucous. Neck: Supple CV: S1 and S2 Lungs: Clear to ascultation bilaterally Abdomen: Soft, Nontender, Nondistended, +bowel sounds. Ext: Good pulses. Trace edema. No clubbing or cyanosis noted.  Right toe swollen, able to flex and extend.  Some swelling at right ankle, in support. Neuro: Cranial Nerves II-XII grossly intact. Has 5/5 motor strength in upper and lower extremities.  Lab results:  Recent Labs  02/27/13 1505  NA 137  K  4.5  CL 101  CO2 28  GLUCOSE 116*  BUN 24*  CREATININE 1.13  CALCIUM 9.5    Recent Labs  02/27/13 1505  AST 22  ALT 15  ALKPHOS 77  BILITOT 0.4  PROT 7.0  ALBUMIN 3.7   No results found for this basename: LIPASE, AMYLASE,  in the last 72 hours  Recent Labs  02/27/13 1505  WBC 11.7*  NEUTROABS 10.7*  HGB 14.3  HCT 40.1  MCV 92.2  PLT 228   No results found for this basename: CKTOTAL, CKMB, CKMBINDEX, TROPONINI,  in the last 72 hours No components found with this basename: POCBNP,  No results found for this basename: DDIMER,  in the last 72 hours No results found for this basename: HGBA1C,  in the last 72 hours No results found for this basename: CHOL, HDL, LDLCALC, TRIG, CHOLHDL, LDLDIRECT,  in the last 72 hours No results found for this basename: TSH, T4TOTAL, FREET3, T3FREE, THYROIDAB,  in the last 72 hours No results found for this basename: VITAMINB12, FOLATE, FERRITIN, TIBC, IRON, RETICCTPCT,  in the last 72 hours Imaging results:  Dg Chest 2 View  02/27/2013  *RADIOLOGY REPORT*  Clinical Data: Loss of consciousness.  Fall.History of melanoma.  CHEST - 2 VIEW  Comparison: 12/11/2005  Findings: Mild S-shaped thoracolumbar spine curvature.Normal heart size and mediastinal contours. No pleural effusion or pneumothorax. Suspect mild scarring at the left lung base versus osseous summation shadow.  IMPRESSION: No acute cardiopulmonary disease.   Original Report Authenticated By: Jeronimo Greaves, M.D.    Dg Ankle Complete Right  02/27/2013  *RADIOLOGY REPORT*  Clinical Data: Fall with ankle pain.  Lateral swelling.  RIGHT ANKLE - COMPLETE 3+ VIEW  Comparison: None.  Findings: Lateral malleolar soft tissue swelling is mild to moderate. No acute fracture or dislocation.  Base of fifth metatarsal and talar dome intact.  Mild tibiotalar osteoarthritis.  IMPRESSION: Lateral malleolar soft tissue swelling only.   Original Report Authenticated By: Jeronimo Greaves, M.D.    Dg Toe Great  Right  02/27/2013  *RADIOLOGY REPORT*  Clinical Data: Fall with right great toe pain.  RIGHT GREAT TOE  Comparison: None.  Findings: Degenerative changes of the first metatarsal phalangeal joint.  An oblique fracture involves the lateral and plantar surface of the proximal most aspect of the distal phalanx of the great toe.  This is intra-articular, and minimally displaced.  IMPRESSION: Distal phalangeal fracture, great toe.   Original Report Authenticated By: Jeronimo Greaves, M.D.    Other results: EKG: Normal sinus rhythm with heart rate of 83, no prior EKGs for comparison  Assessment & Plan by Problem: Syncope/fall Etiology unclear.  EKG unremarkable.  Orthostatics checked in the ER, patient not orthostatic.  Admit to telemetry, cycle cardiac enzymes.  Will do a head CT, given there was concern that patient may have hit his head during the syncopal  episode.  Will get a 2-D echocardiogram to evaluate for ejection fraction and for any structural abnormalities that may have lead to the syncopal episode.  Given patient's history, this does not appear to be reflex mediated syncope such as vasovagal.  Sore throat Etiology unclear.  Uncertain if this is due to allergies.  Will get a rapid strep and check influenza panel. Patient has had his flu shot.  There is no strong clinical impression to suggest that this is viral illness requiring initiation of Tamiflu.  The patient has any fevers with persistent sore throat consider initiating Tamiflu at that time.  Seasonal allergies? Discussion as above.  Hold initiating any allergy medications at this time.  Continue to monitor.  Hypothyroidism Continue levothyroxine.  Right ankle sprain From the fall.  Has ankle support placed in the ER in place.  Right great toe fracture Patient able to flex and extend his toes without any difficulty.  Conservative management.  Leukocytosis Likely reactive.  Prophylaxis Lovenox.  CODE STATUS Full code.  This was  discussed with the patient and wife at the time of admission.  Disposition Admit the patient as observation to telemetry.  Time spent on admission, talking to the patient, and coordinating care was: 60 mins.  REDDY,SRIKAR A, MD 02/27/2013, 4:48 PM

## 2013-02-28 DIAGNOSIS — S93409A Sprain of unspecified ligament of unspecified ankle, initial encounter: Secondary | ICD-10-CM

## 2013-02-28 DIAGNOSIS — E039 Hypothyroidism, unspecified: Secondary | ICD-10-CM | POA: Diagnosis not present

## 2013-02-28 DIAGNOSIS — S92919A Unspecified fracture of unspecified toe(s), initial encounter for closed fracture: Secondary | ICD-10-CM | POA: Diagnosis not present

## 2013-02-28 DIAGNOSIS — R55 Syncope and collapse: Secondary | ICD-10-CM | POA: Diagnosis not present

## 2013-02-28 LAB — BASIC METABOLIC PANEL
BUN: 17 mg/dL (ref 6–23)
CO2: 26 mEq/L (ref 19–32)
Calcium: 8.6 mg/dL (ref 8.4–10.5)
Chloride: 102 mEq/L (ref 96–112)
Creatinine, Ser: 1.01 mg/dL (ref 0.50–1.35)
GFR calc Af Amer: 77 mL/min — ABNORMAL LOW (ref >90)
GFR calc non Af Amer: 66 mL/min — ABNORMAL LOW (ref >90)
Glucose, Bld: 125 mg/dL — ABNORMAL HIGH (ref 70–99)
Potassium: 4.1 mEq/L (ref 3.5–5.1)
Sodium: 139 mEq/L (ref 135–145)

## 2013-02-28 LAB — TROPONIN I
Troponin I: <0.3 ng/mL (ref <0.30)
Troponin I: <0.3 ng/mL (ref <0.30)

## 2013-02-28 LAB — CBC
HCT: 36.4 % — ABNORMAL LOW (ref 39.0–52.0)
Hemoglobin: 12.8 g/dL — ABNORMAL LOW (ref 13.0–17.0)
MCH: 32.5 pg (ref 26.0–34.0)
MCHC: 35.2 g/dL (ref 30.0–36.0)
MCV: 92.4 fL (ref 78.0–100.0)
Platelets: 195 10*3/uL (ref 150–400)
RBC: 3.94 MIL/uL — ABNORMAL LOW (ref 4.22–5.81)
RDW: 12.4 % (ref 11.5–15.5)
WBC: 6.3 10*3/uL (ref 4.0–10.5)

## 2013-02-28 MED ORDER — FLUTICASONE PROPIONATE 50 MCG/ACT NA SUSP
2.0000 | Freq: Every day | NASAL | Status: DC
  Administered 2013-02-28 – 2013-03-01 (×2): 2 via NASAL
  Filled 2013-02-28: qty 16

## 2013-02-28 NOTE — Progress Notes (Signed)
  Echocardiogram 2D Echocardiogram has been performed.  Ronalee Scheunemann FRANCES 02/28/2013, 4:55 PM 

## 2013-02-28 NOTE — Progress Notes (Signed)
Unlaced patients splint/brace to right foot, skin intact, slight swelling of lower leg and foot.  Has kept elevated today.  Able to walk on right foot with slight limp.   Bonney Leitz RN

## 2013-02-28 NOTE — Care Management Note (Unsigned)
    Page 1 of 1   02/28/2013     11:03:10 AM   CARE MANAGEMENT NOTE 02/28/2013  Patient:  Dominic Garcia, Dominic Garcia   Account Number:  [id number]  Date Initiated:  02/28/2013  Documentation initiated by:  GRAVES-BIGELOW,BRENDA  Subjective/Objective Assessment:   Pt admitted with syncope.     Action/Plan:   CM will continue to monitor for disposition needs.   Anticipated DC Date:  03/01/2013   Anticipated DC Plan:        DC Planning Services  CM consult      Choice offered to / List presented to:             Status of service:  In process, will continue to follow Medicare Important Message given?   (If response is "NO", the following Medicare IM given date fields will be blank) Date Medicare IM given:   Date Additional Medicare IM given:    Discharge Disposition:    Per UR Regulation:  Reviewed for med. necessity/level of care/duration of stay  If discussed at Long Length of Stay Meetings, dates discussed:    Comments:

## 2013-02-28 NOTE — Progress Notes (Signed)
UR Completed Brenda Graves-Bigelow, RN,BSN [phone]  

## 2013-02-28 NOTE — Progress Notes (Signed)
[facility] PROGRESS NOTE  Dominic Garcia ZOX:[id number] DOB: 1926/12/12 DOA: 02/27/2013 PCP: Mickie Hillier, MD  HPI/Subjective: He denies any dizziness or headache.  Assessment/Plan:  Syncope -Unclear etiology, EKG unremarkable orthostatic was negative in the emergency department. -Patient is on telemetry, 3 sets of cardiac enzymes was negative for ACS. -CT scan is negative, no focal neurological signs I do not suspect primary CNS event, daycare MRI. -Check 2-D echocardiogram, ambulate.  Rhinitis -Patient probably has allergic rhinitis with runny nose and postnasal drip. -Strep and flu are negative. -Patient started already on Claritin, I will add Flonase.  Hypothyroidism -Continue levothyroxine, check TSH  Right ankle sprain with right great toe fracture -Secondary to the fall, ankle placed on brace in the emergency department. - Patient was able to move his toe without difficulty, considering management.   Code Status: Full code Family Communication: Discussed with the patient Disposition Plan: Inpatient   Consultants:  None  Procedures:  None  Antibiotics:  None    Objective: Filed Vitals:   02/27/13 1823 02/27/13 2053 02/27/13 2210 02/28/13 0641  BP: 103/66 124/58 113/59 106/57  Pulse: 82 76 90 83  Temp:  98.7 F (37.1 C) 98.8 F (37.1 C) 99.3 F (37.4 C)  TempSrc:      Resp: 20 18 20 20   Height:  5\' 6"  (1.676 m)    Weight:  67.1 kg (147 lb 14.9 oz)  67.1 kg (147 lb 14.9 oz)  SpO2: 99% 96% 93% 94%    Intake/Output Summary (Last 24 hours) at 02/28/13 1350 Last data filed at 02/28/13 1037  Gross per 24 hour  Intake   1290 ml  Output    900 ml  Net    390 ml   Filed Weights   02/27/13 2053 02/28/13 0641  Weight: 67.1 kg (147 lb 14.9 oz) 67.1 kg (147 lb 14.9 oz)    Exam:  General: Alert and awake, oriented x3, not in any acute distress. HEENT: anicteric sclera, pupils reactive to light and accommodation, EOMI CVS: S1-S2  clear, no murmur rubs or gallops Chest: clear to auscultation bilaterally, no wheezing, rales or rhonchi Abdomen: soft nontender, nondistended, normal bowel sounds, no organomegaly Extremities: no cyanosis, clubbing or edema noted bilaterally Neuro: Cranial nerves II-XII intact, no focal neurological deficits  Data Reviewed: Basic Metabolic Panel:  Recent Labs Lab 02/27/13 1505 02/28/13 0610  NA 137 139  K 4.5 4.1  CL 101 102  CO2 28 26  GLUCOSE 116* 125*  BUN 24* 17  CREATININE 1.13 1.01  CALCIUM 9.5 8.6   Liver Function Tests:  Recent Labs Lab 02/27/13 1505  AST 22  ALT 15  ALKPHOS 77  BILITOT 0.4  PROT 7.0  ALBUMIN 3.7   No results found for this basename: LIPASE, AMYLASE,  in the last 168 hours No results found for this basename: AMMONIA,  in the last 168 hours CBC:  Recent Labs Lab 02/27/13 1505 02/28/13 0610  WBC 11.7* 6.3  NEUTROABS 10.7*  --   HGB 14.3 12.8*  HCT 40.1 36.4*  MCV 92.2 92.4  PLT 228 195   Cardiac Enzymes:  Recent Labs Lab 02/27/13 1641 02/27/13 2224 02/28/13 0610  TROPONINI <0.30 <0.30 <0.30   BNP (last 3 results) No results found for this basename: PROBNP,  in the last 8760 hours CBG: No results found for this basename: GLUCAP,  in the last 168 hours  Recent Results (from the past 240 hour(s))  RAPID STREP SCREEN     Status:  None   Collection Time    02/27/13  5:04 PM      Result Value Range Status   Streptococcus, Group A Screen (Direct) NEGATIVE  NEGATIVE Final   Comment:            DUE TO INADEQUATE SENSITIVITY OF EIA     RAPID TESTS FOR GROUP A STREP (GAS)     IT IS RECOMMENDED THAT ALL NEGATIVE     RESULTS BE FOLLOWED BY A     GROUP A STREP PROBE.     Studies: Dg Chest 2 View  02/27/2013  *RADIOLOGY REPORT*  Clinical Data: Loss of consciousness.  Fall.History of melanoma.  CHEST - 2 VIEW  Comparison: 12/11/2005  Findings: Mild S-shaped thoracolumbar spine curvature.Normal heart size and mediastinal contours. No  pleural effusion or pneumothorax. Suspect mild scarring at the left lung base versus osseous summation shadow.  IMPRESSION: No acute cardiopulmonary disease.   Original Report Authenticated By: Jeronimo Greaves, M.D.    Dg Ankle Complete Right  02/27/2013  *RADIOLOGY REPORT*  Clinical Data: Fall with ankle pain.  Lateral swelling.  RIGHT ANKLE - COMPLETE 3+ VIEW  Comparison: None.  Findings: Lateral malleolar soft tissue swelling is mild to moderate. No acute fracture or dislocation.  Base of fifth metatarsal and talar dome intact.  Mild tibiotalar osteoarthritis.  IMPRESSION: Lateral malleolar soft tissue swelling only.   Original Report Authenticated By: Jeronimo Greaves, M.D.    Ct Head Wo Contrast  02/27/2013  *RADIOLOGY REPORT*  Clinical Data: Syncope  CT HEAD WITHOUT CONTRAST  Technique:  Contiguous axial images were obtained from the base of the skull through the vertex without contrast.  Comparison: None  Findings: Age appropriate atrophy.  Mild chronic microvascular ischemic changes in the white matter.  Negative for acute infarct. Negative for hemorrhage or mass lesion.  Calvarium is intact.  IMPRESSION: No acute abnormality.   Original Report Authenticated By: Janeece Riggers, M.D.    Dg Toe Great Right  02/27/2013  *RADIOLOGY REPORT*  Clinical Data: Fall with right great toe pain.  RIGHT GREAT TOE  Comparison: None.  Findings: Degenerative changes of the first metatarsal phalangeal joint.  An oblique fracture involves the lateral and plantar surface of the proximal most aspect of the distal phalanx of the great toe.  This is intra-articular, and minimally displaced.  IMPRESSION: Distal phalangeal fracture, great toe.   Original Report Authenticated By: Jeronimo Greaves, M.D.     Scheduled Meds: . [START ON 03/01/2013] aspirin EC  81 mg Oral 3 times weekly  . enoxaparin (LOVENOX) injection  40 mg Subcutaneous Q24H  . levothyroxine  75 mcg Oral QAC breakfast  . loratadine  10 mg Oral Daily  . multivitamin with  minerals  1 tablet Oral Daily  . vitamin C  500 mg Oral Daily   Continuous Infusions: . sodium chloride 75 mL/hr at 02/27/13 2100    Principal Problem:   Syncope and collapse Active Problems:   Fall   Fracture of great toe, right, closed   Sore throat   Seasonal allergies   Hypothyroidism   Right ankle sprain    Time spent: 35 minutes    [facility] A  [facility] Pager [phone]. If 7PM-7AM, please contact night-coverage at [email or web address], password [facility] 02/28/2013, 1:50 PM  LOS: 1 day

## 2013-03-01 DIAGNOSIS — E039 Hypothyroidism, unspecified: Secondary | ICD-10-CM | POA: Diagnosis not present

## 2013-03-01 DIAGNOSIS — W19XXXA Unspecified fall, initial encounter: Secondary | ICD-10-CM

## 2013-03-01 DIAGNOSIS — S92919A Unspecified fracture of unspecified toe(s), initial encounter for closed fracture: Secondary | ICD-10-CM | POA: Diagnosis not present

## 2013-03-01 DIAGNOSIS — S93409A Sprain of unspecified ligament of unspecified ankle, initial encounter: Secondary | ICD-10-CM | POA: Diagnosis not present

## 2013-03-01 DIAGNOSIS — R55 Syncope and collapse: Secondary | ICD-10-CM | POA: Diagnosis not present

## 2013-03-01 MED ORDER — LORATADINE 10 MG PO TABS: 10.0000 mg | ORAL_TABLET | Freq: Every day | ORAL | Status: DC

## 2013-03-01 MED ORDER — FLUTICASONE PROPIONATE 50 MCG/ACT NA SUSP: 2.0000 | Freq: Every day | NASAL | Status: DC

## 2013-03-01 NOTE — Progress Notes (Signed)
Dominic Garcia to be D/C'd Home per MD order.  Discussed with the patient and all questions fully answered.    Medication List    TAKE these medications       aspirin EC 81 MG tablet  Take 81 mg by mouth 3 (three) times a week.     fluticasone 50 MCG/ACT nasal spray  Commonly known as:  FLONASE  Place 2 sprays into the nose daily.     ICAPS PO  Take 1 capsule by mouth every morning.     levothyroxine 75 MCG tablet  Commonly known as:  SYNTHROID, LEVOTHROID  Take 75 mcg by mouth daily.     loratadine 10 MG tablet  Commonly known as:  CLARITIN  Take 1 tablet (10 mg total) by mouth daily.     multivitamin with minerals Tabs  Take 1 tablet by mouth daily.     OSTEO BI-FLEX REGULAR STRENGTH 250-200 MG Tabs  Generic drug:  Glucosamine-Chondroitin  Take 1 capsule by mouth 2 (two) times daily.     vitamin C 500 MG tablet  Commonly known as:  ASCORBIC ACID  Take 500 mg by mouth daily.        VVS, Skin clean, dry and intact without evidence of skin break down, no evidence of skin tears noted. IV catheter discontinued intact. Site without signs and symptoms of complications. Dressing and pressure applied.  An After Visit Summary was printed and given to the patient. Follow up appointments , new prescriptions and medication administration times given. Pt education on sprained ankle given Patient escorted via WC, and D/C home via private auto.  [name], RN 03/01/2013 12:50 PM

## 2013-03-01 NOTE — Progress Notes (Signed)
Addendum  Patient seen and examined, chart and data base reviewed.  I agree with the above assessment and discharge plan.  For full details please see Mrs. Algis Downs PA note.  Syncope, initial workup is negative, 2-D echo is pending at the time of dictation he'll followup with primary care physician.   Clint Lipps, MD [facility] Pager: [phone] 03/01/2013, 3:54 PM

## 2013-03-01 NOTE — Progress Notes (Signed)
Physician Discharge Summary  Dominic Garcia ZOX:[id number] DOB: [birth year] DOA: 02/27/2013  PCP: Mickie Hillier, MD  Admit date: 02/27/2013 Discharge date: 03/01/2013  Time spent: 40 minutes  Recommendations for Outpatient Follow-up:  Follow up on 2D Echo results that were still pending at the time discharge.  Consider TSH / Free T4 follow up.  Monitor Right great toe fracture and Right ankle sprain.  Discharge Diagnoses:  Principal Problem:   Syncope and collapse Active Problems:   Fall   Fracture of great toe, right, closed   Sore throat   Seasonal allergies   Hypothyroidism   Right ankle sprain   Discharge Condition: stable,  Walking the hallways without dizziness  Diet recommendation: Heart Healthy  Filed Weights   02/27/13 2053 02/28/13 0641 03/01/13 0513  Weight: 67.1 kg (147 lb 14.9 oz) 67.1 kg (147 lb 14.9 oz) 66.18 kg (145 lb 14.4 oz)    History of present illness at the time of admission:  Dominic Garcia is a 79 y.o. Caucasian male with history of hypothyroidism and melanoma treated at [facility], who presents with the above complaints. He reported that during the night at 2 a.m. he had sore throat and some sinus congestion, which he attributes to allergies. Wife reports that they have recently changed their pillows to goose feather pillows. This morning he reports he was feeling wobbly, denied dizziness or lightheadedness. At 1130 a.m. he was helping his wife who was on the ladder, holding the ladder, while she was cleaning in the kitchen. He lost consciousness and fell to the right side. Prior to the fall he denies any dizziness or lightheadedness. He denies having any symptoms prior to the fall. Per wife he lost consciousness for about 2-3 minutes. She believes that he may have struck his head. She noted possible twitching of his leg. He was not incontinent of bowel or bladder. Eventually he regained consciousness, he then sat in a chair and again fell to his right  side. EMS was called and he presented to the ER for further evaluation. Patient denies any recent fevers, chills, vomiting, chest pain, shortness of breath, abdominal pain, diarrhea, headaches or vision changes. He does report feeling nauseated this morning.   Hospital Course:  Syncope  -Unclear etiology, EKG unremarkable orthostatic vital signs were  negative in the emergency department and again on the day of discharge. -Patient was monitored on telemetry, 3 sets of cardiac enzymes were negative for ACS.  -CT scan is negative, no focal neurological signs I do not suspect primary CNS event, defer MRI.  -Completed 2-D echocardiogram, but results are still pending.  Patient anxious for discharge.  Will ask PCP to follow Echo results.  Rhinitis  -Patient probably has allergic rhinitis with runny nose and postnasal drip.  -Strep and flu are negative.  -Patient started already on Claritin, I will add Flonase.   Hypothyroidism  -Recommend TSH per PCP in follow up.  Right ankle sprain with right great toe fracture  -Secondary to the fall, ankle placed on brace in the emergency department.  - Patient was able to move his toe without difficulty, conservative management.    Procedures:  2D echo results Pending at discharge   Discharge Exam: Filed Vitals:   03/01/13 0513 03/01/13 0936 03/01/13 0938 03/01/13 0940  BP: 100/54 106/59 109/58 113/64  Pulse: 79 84 84 87  Temp: 98.4 F (36.9 C) 98.3 F (36.8 C)    TempSrc: Oral Oral    Resp: 18 18  18  Height:      Weight: 66.18 kg (145 lb 14.4 oz)     SpO2: 92% 94%      General: A&O, NAD, Well Appearing Cardiovascular: RRR, no M/R/G Respiratory: CTA, no W/C/R. Abdomen:  Soft, Nt, ND, +BS, No masses Neuro:  CN II-I2 grossly in tact, non-focal.  Discharge Instructions  Discharge Orders   Future Orders Complete By Expires     Diet - low sodium heart healthy  As directed     Increase activity slowly  As directed          Medication List    TAKE these medications       aspirin EC 81 MG tablet  Take 81 mg by mouth 3 (three) times a week.     fluticasone 50 MCG/ACT nasal spray  Commonly known as:  FLONASE  Place 2 sprays into the nose daily.     ICAPS PO  Take 1 capsule by mouth every morning.     levothyroxine 75 MCG tablet  Commonly known as:  SYNTHROID, LEVOTHROID  Take 75 mcg by mouth daily.     loratadine 10 MG tablet  Commonly known as:  CLARITIN  Take 1 tablet (10 mg total) by mouth daily.     multivitamin with minerals Tabs  Take 1 tablet by mouth daily.     OSTEO BI-FLEX REGULAR STRENGTH 250-200 MG Tabs  Generic drug:  Glucosamine-Chondroitin  Take 1 capsule by mouth 2 (two) times daily.     vitamin C 500 MG tablet  Commonly known as:  ASCORBIC ACID  Take 500 mg by mouth daily.           Follow-up Information   Follow up with Mickie Hillier, MD. Schedule an appointment as soon as possible for a visit in 1 week.   Contact information:   [address] [place] [address] [phone]        The results of significant diagnostics from this hospitalization (including imaging, microbiology, ancillary and laboratory) are listed below for reference.    Significant Diagnostic Studies: Dg Chest 2 View  02/27/2013  *RADIOLOGY REPORT*  Clinical Data: Loss of consciousness.  Fall.History of melanoma.  CHEST - 2 VIEW  Comparison: 12/11/2005  Findings: Mild S-shaped thoracolumbar spine curvature.Normal heart size and mediastinal contours. No pleural effusion or pneumothorax. Suspect mild scarring at the left lung base versus osseous summation shadow.  IMPRESSION: No acute cardiopulmonary disease.   Original Report Authenticated By: Jeronimo Greaves, M.D.    Dg Ankle Complete Right  02/27/2013  *RADIOLOGY REPORT*  Clinical Data: Fall with ankle pain.  Lateral swelling.  RIGHT ANKLE - COMPLETE 3+ VIEW  Comparison: None.  Findings: Lateral malleolar soft tissue swelling is mild to  moderate. No acute fracture or dislocation.  Base of fifth metatarsal and talar dome intact.  Mild tibiotalar osteoarthritis.  IMPRESSION: Lateral malleolar soft tissue swelling only.   Original Report Authenticated By: Jeronimo Greaves, M.D.    Ct Head Wo Contrast  02/27/2013  *RADIOLOGY REPORT*  Clinical Data: Syncope  CT HEAD WITHOUT CONTRAST  Technique:  Contiguous axial images were obtained from the base of the skull through the vertex without contrast.  Comparison: None  Findings: Age appropriate atrophy.  Mild chronic microvascular ischemic changes in the white matter.  Negative for acute infarct. Negative for hemorrhage or mass lesion.  Calvarium is intact.  IMPRESSION: No acute abnormality.   Original Report Authenticated By: Janeece Riggers, M.D.    Dg [address]  Right  02/27/2013  *RADIOLOGY REPORT*  Clinical Data: Fall with right great toe pain.  RIGHT GREAT TOE  Comparison: None.  Findings: Degenerative changes of the first metatarsal phalangeal joint.  An oblique fracture involves the lateral and plantar surface of the proximal most aspect of the distal phalanx of the great toe.  This is intra-articular, and minimally displaced.  IMPRESSION: Distal phalangeal fracture, great toe.   Original Report Authenticated By: Jeronimo Greaves, M.D.     Microbiology: Recent Results (from the past 240 hour(s))  RAPID STREP SCREEN     Status: None   Collection Time    02/27/13  5:04 PM      Result Value Range Status   Streptococcus, Group A Screen (Direct) NEGATIVE  NEGATIVE Final   Comment:            DUE TO INADEQUATE SENSITIVITY OF EIA     RAPID TESTS FOR GROUP A STREP (GAS)     IT IS RECOMMENDED THAT ALL NEGATIVE     RESULTS BE FOLLOWED BY A     GROUP A STREP PROBE.     Labs: Basic Metabolic Panel:  Recent Labs Lab 02/27/13 1505 02/28/13 0610  NA 137 139  K 4.5 4.1  CL 101 102  CO2 28 26  GLUCOSE 116* 125*  BUN 24* 17  CREATININE 1.13 1.01  CALCIUM 9.5 8.6   Liver Function Tests:  Recent  Labs Lab 02/27/13 1505  AST 22  ALT 15  ALKPHOS 77  BILITOT 0.4  PROT 7.0  ALBUMIN 3.7   CBC:  Recent Labs Lab 02/27/13 1505 02/28/13 0610  WBC 11.7* 6.3  NEUTROABS 10.7*  --   HGB 14.3 12.8*  HCT 40.1 36.4*  MCV 92.2 92.4  PLT 228 195   Cardiac Enzymes:  Recent Labs Lab 02/27/13 1641 02/27/13 2224 02/28/13 0610  TROPONINI <0.30 <0.30 <0.30     Signed:  Conley Canal [phone] [facility] 03/01/2013, 11:11 AM

## 2013-03-09 NOTE — Discharge Summary (Signed)
Stephani Police, PA-C Physician Assistant  Signed   Internal Medicine   Service date: 03/01/2013 11:10 AM  Physician Discharge Summary    [name]:[id number] DOB: 21-Feb-1926 DOA: 02/27/2013   PCP: Mickie Hillier, MD   Admit date: 02/27/2013 Discharge date: 03/01/2013   Time spent: 40 minutes   Recommendations for Outpatient Follow-up:   Follow up on 2D Echo results that were still pending at the time discharge.   Consider TSH / Free T4 follow up.   Monitor Right great toe fracture and Right ankle sprain.   Discharge Diagnoses:   Principal Problem:   Syncope and collapse Active Problems:   Fall   Fracture of great toe, right, closed   Sore throat   Seasonal allergies   Hypothyroidism   Right ankle sprain   Discharge Condition: stable,  Walking the hallways without dizziness   Diet recommendation: Heart Healthy    Filed Weights     02/27/13 2053  02/28/13 0641  03/01/13 0513   Weight:  67.1 kg (147 lb 14.9 oz)  67.1 kg (147 lb 14.9 oz)  66.18 kg (145 lb 14.4 oz)      History of present illness at the time of admission:   Dominic Garcia is a 79 y.o. Caucasian male with history of hypothyroidism and melanoma treated at [facility], who presents with the above complaints. He reported that during the night at 2 a.m. he had sore throat and some sinus congestion, which he attributes to allergies. Wife reports that they have recently changed their pillows to goose feather pillows. This morning he reports he was feeling wobbly, denied dizziness or lightheadedness. At 1130 a.m. he was helping his wife who was on the ladder, holding the ladder, while she was cleaning in the kitchen. He lost consciousness and fell to the right side. Prior to the fall he denies any dizziness or lightheadedness. He denies having any symptoms prior to the fall. Per wife he lost consciousness for about 2-3 minutes. She believes that he may have struck his head. She noted possible twitching of  his leg. He was not incontinent of bowel or bladder. Eventually he regained consciousness, he then sat in a chair and again fell to his right side. EMS was called and he presented to the ER for further evaluation. Patient denies any recent fevers, chills, vomiting, chest pain, shortness of breath, abdominal pain, diarrhea, headaches or vision changes. He does report feeling nauseated this morning.     Hospital Course:  Syncope   -Unclear etiology, EKG unremarkable orthostatic vital signs were  negative in the emergency department and again on the day of discharge. -Patient was monitored on telemetry, 3 sets of cardiac enzymes were negative for ACS.   -CT scan is negative, no focal neurological signs I do not suspect primary CNS event, defer MRI.   -Completed 2-D echocardiogram, but results are still pending.  Patient anxious for discharge.  Will ask PCP to follow Echo results.   Rhinitis   -Patient probably has allergic rhinitis with runny nose and postnasal drip.   -Strep and flu are negative.   -Patient started already on Claritin, I will add Flonase.    Hypothyroidism   -Recommend TSH per PCP in follow up.   Right ankle sprain with right great toe fracture   -Secondary to the fall, ankle placed on brace in the emergency department.   - Patient was able to move his toe without difficulty, conservative management.  Procedures: 2D echo results Pending at discharge     Discharge Exam: Filed Vitals:     03/01/13 0513  03/01/13 0936  03/01/13 0938  03/01/13 0940   BP:  100/54  106/59  109/58  113/64   Pulse:  79  84  84  87   Temp:  98.4 F (36.9 C)  98.3 F (36.8 C)       TempSrc:  Oral  Oral       Resp:  18  18    18    Height:           Weight:  66.18 kg (145 lb 14.4 oz)         SpO2:  92%  94%          General: A&O, NAD, Well Appearing Cardiovascular: RRR, no M/R/G Respiratory: CTA, no W/C/R. Abdomen:  Soft, Nt, ND, +BS, No masses Neuro:  CN II-I2 grossly in  tact, non-focal.   Discharge Instructions    Discharge Orders     Future Orders  Complete By  Expires        Diet - low sodium heart healthy   As directed         Increase activity slowly   As directed              Medication List      TAKE these medications           aspirin EC 81 MG tablet   Take 81 mg by mouth 3 (three) times a week.         fluticasone 50 MCG/ACT nasal spray   Commonly known as:  FLONASE   Place 2 sprays into the nose daily.         ICAPS PO   Take 1 capsule by mouth every morning.         levothyroxine 75 MCG tablet   Commonly known as:  SYNTHROID, LEVOTHROID   Take 75 mcg by mouth daily.         loratadine 10 MG tablet   Commonly known as:  CLARITIN   Take 1 tablet (10 mg total) by mouth daily.         multivitamin with minerals Tabs   Take 1 tablet by mouth daily.         OSTEO BI-FLEX REGULAR STRENGTH 250-200 MG Tabs   Generic drug:  Glucosamine-Chondroitin   Take 1 capsule by mouth 2 (two) times daily.         vitamin C 500 MG tablet   Commonly known as:  ASCORBIC ACID   Take 500 mg by mouth daily.                 Follow-up Information     Follow up with Mickie Hillier, MD. Schedule an appointment as soon as possible for a visit in 1 week.     Contact information:     [address] [place] [address] [phone]           --------------------------------------------------------------------------------   The results of significant diagnostics from this hospitalization (including imaging, microbiology, ancillary and laboratory) are listed below for reference.       Significant Diagnostic Studies: Dg Chest 2 View   02/27/2013  *RADIOLOGY REPORT*  Clinical Data: Loss of consciousness.  Fall.History of melanoma.  CHEST - 2 VIEW  Comparison: 12/11/2005  Findings: Mild S-shaped thoracolumbar spine curvature.Normal heart size and mediastinal contours. No pleural effusion or pneumothorax. Suspect mild  scarring at  the left lung base versus osseous summation shadow.  IMPRESSION: No acute cardiopulmonary disease.   Original Report Authenticated By: Jeronimo Greaves, M.D.     Dg Ankle Complete Right   02/27/2013  *RADIOLOGY REPORT*  Clinical Data: Fall with ankle pain.  Lateral swelling.  RIGHT ANKLE - COMPLETE 3+ VIEW  Comparison: None.  Findings: Lateral malleolar soft tissue swelling is mild to moderate. No acute fracture or dislocation.  Base of fifth metatarsal and talar dome intact.  Mild tibiotalar osteoarthritis.  IMPRESSION: Lateral malleolar soft tissue swelling only.   Original Report Authenticated By: Jeronimo Greaves, M.D.     Ct Head Wo Contrast   02/27/2013  *RADIOLOGY REPORT*  Clinical Data: Syncope  CT HEAD WITHOUT CONTRAST  Technique:  Contiguous axial images were obtained from the base of the skull through the vertex without contrast.  Comparison: None  Findings: Age appropriate atrophy.  Mild chronic microvascular ischemic changes in the white matter.  Negative for acute infarct. Negative for hemorrhage or mass lesion.  Calvarium is intact.  IMPRESSION: No acute abnormality.   Original Report Authenticated By: Janeece Riggers, M.D.     Dg Toe Great Right   02/27/2013  *RADIOLOGY REPORT*  Clinical Data: Fall with right great toe pain.  RIGHT GREAT TOE  Comparison: None.  Findings: Degenerative changes of the first metatarsal phalangeal joint.  An oblique fracture involves the lateral and plantar surface of the proximal most aspect of the distal phalanx of the great toe.  This is intra-articular, and minimally displaced.  IMPRESSION: Distal phalangeal fracture, great toe.   Original Report Authenticated By: Jeronimo Greaves, M.D.       Microbiology: Recent Results (from the past 240 hour(s))   RAPID STREP SCREEN     Status: None     Collection Time      02/27/13  5:04 PM       Result  Value  Range  Status     Streptococcus, Group A Screen (Direct)  NEGATIVE   NEGATIVE  Final     Comment:                 DUE TO INADEQUATE SENSITIVITY OF EIA        RAPID TESTS FOR GROUP A STREP (GAS)        IT IS RECOMMENDED THAT ALL NEGATIVE        RESULTS BE FOLLOWED BY A        GROUP A STREP PROBE.      Labs: Basic Metabolic Panel: Recent Labs Lab  02/27/13 1505  02/28/13 0610   NA  137  139   K  4.5  4.1   CL  101  102   CO2  28  26   GLUCOSE  116*  125*   BUN  24*  17   CREATININE  1.13  1.01   CALCIUM  9.5  8.6    Liver Function Tests: Recent Labs Lab  02/27/13 1505   AST  22   ALT  15   ALKPHOS  77   BILITOT  0.4   PROT  7.0   ALBUMIN  3.7    CBC: Recent Labs Lab  02/27/13 1505  02/28/13 0610   WBC  11.7*  6.3   NEUTROABS  10.7*   --    HGB  14.3  12.8*   HCT  40.1  36.4*   MCV  92.2  92.4   PLT  228  195  Cardiac Enzymes: Recent Labs Lab  02/27/13 1641  02/27/13 2224  02/28/13 0610   TROPONINI  <0.30  <0.30  <0.30        Signed:   Conley Canal [phone] [facility] 03/01/2013, 11:11 AM

## 2013-03-11 DIAGNOSIS — D126 Benign neoplasm of colon, unspecified: Secondary | ICD-10-CM | POA: Diagnosis not present

## 2013-03-11 DIAGNOSIS — Z86010 Personal history of colonic polyps: Secondary | ICD-10-CM | POA: Diagnosis not present

## 2013-03-11 DIAGNOSIS — K648 Other hemorrhoids: Secondary | ICD-10-CM | POA: Diagnosis not present

## 2013-03-11 DIAGNOSIS — Z800 Family history of malignant neoplasm of digestive organs: Secondary | ICD-10-CM | POA: Diagnosis not present

## 2013-03-11 DIAGNOSIS — Z1211 Encounter for screening for malignant neoplasm of colon: Secondary | ICD-10-CM | POA: Diagnosis not present

## 2013-03-12 DIAGNOSIS — R55 Syncope and collapse: Secondary | ICD-10-CM | POA: Diagnosis not present

## 2013-03-15 ENCOUNTER — Other Ambulatory Visit: Payer: Self-pay | Admitting: Family Medicine

## 2013-03-15 DIAGNOSIS — R55 Syncope and collapse: Secondary | ICD-10-CM

## 2013-03-25 DIAGNOSIS — R55 Syncope and collapse: Secondary | ICD-10-CM | POA: Diagnosis not present

## 2013-03-29 ENCOUNTER — Ambulatory Visit
Admission: RE | Admit: 2013-03-29 | Discharge: 2013-03-29 | Disposition: A | Discharge status: Home / Self Care | Payer: Medicare Other | Source: Ambulatory Visit | Attending: Family Medicine | Admitting: Family Medicine

## 2013-03-29 DIAGNOSIS — R55 Syncope and collapse: Secondary | ICD-10-CM

## 2013-03-31 DIAGNOSIS — R55 Syncope and collapse: Secondary | ICD-10-CM | POA: Diagnosis not present

## 2013-04-02 ENCOUNTER — Encounter: Payer: Self-pay | Admitting: Diagnostic Neuroimaging

## 2013-04-02 ENCOUNTER — Ambulatory Visit (INDEPENDENT_AMBULATORY_CARE_PROVIDER_SITE_OTHER): Payer: Medicare Other | Admitting: Diagnostic Neuroimaging

## 2013-04-02 VITALS — BP 107/60 | HR 67 | Temp 97.9°F | Ht 66.5 in | Wt 148.5 lb

## 2013-04-02 DIAGNOSIS — R55 Syncope and collapse: Secondary | ICD-10-CM

## 2013-04-02 NOTE — Progress Notes (Signed)
[facility]  PATIENT: Dominic Garcia DOB: 06-Feb-1926  REFERRING CLINICIAN: Little HISTORY FROM: patient (came alone) REASON FOR VISIT: new consult   HISTORICAL  CHIEF COMPLAINT:  Chief Complaint  Patient presents with  . Loss of Consciousness    HISTORY OF PRESENT ILLNESS:   79 year old right-handed male with hypothyroidism, melanoma, here for evaluation of passing out episode.  Patient got lost while coming to the appointment. He then delayed the beginning of the appointment because he insisted on going to the car to bring an allergy medication yet been taking. Patient repeated himself several times throughout our examination. Unfortunately his wife did not come to this office visit to provide collateral history. I reviewed the hospital records, referring PCP notes, imaging, labs myself.  In summary, patient is here for evaluation of unprovoked syncopal event on 02/27/2013. That morning he woke up with a sore throat and was not feeling well. He had been climbing on a ladder fixing a ceiling fan and had come down off of the ladder. He was standing next to the ladder when without warning, prodromal dizziness or lightheadedness, he passed out and collapsed to the ground. His wife helped him into a chair and then he fell off again. No tongue biting or incontinence. Unclear if there were convulsions or not.  REVIEW OF SYSTEMS: Full 14 system review of systems performed and notable only for hearing loss snoring rectal dysfunction.  ALLERGIES: Allergies  Allergen Reactions  . Mucinex (Guaifenesin Er) Swelling and Rash    HOME MEDICATIONS: Outpatient Prescriptions Prior to Visit  Medication Sig Dispense Refill  . fluticasone (FLONASE) 50 MCG/ACT nasal spray Place 2 sprays into the nose daily.  16 g  3  . Glucosamine-Chondroitin (OSTEO BI-FLEX REGULAR STRENGTH) 250-200 MG TABS Take 1 capsule by mouth 2 (two) times daily.      Marland Kitchen levothyroxine (SYNTHROID,  LEVOTHROID) 75 MCG tablet Take 75 mcg by mouth daily.      . Multiple Vitamin (MULTIVITAMIN WITH MINERALS) TABS Take 1 tablet by mouth daily.      . Multiple Vitamins-Minerals (ICAPS PO) Take 1 capsule by mouth every morning.       . vitamin C (ASCORBIC ACID) 500 MG tablet Take 500 mg by mouth daily.      Marland Kitchen aspirin EC 81 MG tablet Take 81 mg by mouth 3 (three) times a week.      . loratadine (CLARITIN) 10 MG tablet Take 1 tablet (10 mg total) by mouth daily.  30 tablet  0   No facility-administered medications prior to visit.    PAST MEDICAL HISTORY: Past Medical History  Diagnosis Date  . Thyroid disease   . Melanoma     treated at [facility]    PAST SURGICAL HISTORY: Past Surgical History  Procedure Laterality Date  . Appendectomy    . Tonsillectomy    . Cholecystectomy    . Melanoma excision      arm    FAMILY HISTORY: Family History  Problem Relation Age of Onset  . Renal Disease Father   . Aneurysm Father     SOCIAL HISTORY:  History   Social History  . Marital Status: Married    Spouse Name: Austin Miles    Number of Children: 3  . Years of Education: college gr   Occupational History  . other     [employer]   Social History Main Topics  . Smoking status: Former Smoker -- 0.50 packs/day for 10 years  Quit date: 02/27/1953  . Smokeless tobacco: Not on file  . Alcohol Use: No     Comment: Wine, occasionally  . Drug Use: No  . Sexually Active: Not on file   Other Topics Concern  . Not on file   Social History Narrative      Pt lives at home with his spouse.   Caffeine Use- Does not consume.     PHYSICAL EXAM  Filed Vitals:   04/02/13 0904 04/02/13 0919  BP:  107/60  Pulse:  67  Temp: 97.9 F (36.6 C)   TempSrc: Oral   Height: 5' 6.5" (1.689 m)   Weight: 148 lb 8 oz (67.359 kg)    Body mass index is 23.61 kg/(m^2).  GENERAL EXAM: Patient is in no distress  CARDIOVASCULAR: Regular rate and rhythm, no murmurs, no  carotid bruits  NEUROLOGIC: MENTAL STATUS: awake, alert, language fluent, comprehension intact, naming intact; PERSEVERATES. SEEMS CONFUSED. APOLOGETIC. SAYS "I'M SORRY, I DON'T KNOW". POSITIVE SNOUT, MYERSONS AND PALMOMENTAL REFLEXES. MMSE 26/30. AFT 11. CDT normal. GDS 0. CRANIAL NERVE: no papilledema on fundoscopic exam, pupils equal and reactive to light, visual fields full to confrontation, extraocular muscles intact, no nystagmus, facial sensation and strength symmetric, uvula midline, shoulder shrug symmetric, tongue midline. MOTOR: normal bulk and tone, full strength in the BUE, BLE SENSORY: normal and symmetric to light touch, pinprick, temperature, vibration COORDINATION: finger-nose-finger, fine finger movements normal REFLEXES: deep tendon reflexes present and symmetric GAIT/STATION: narrow based gait; able to walk on toes, heels and tandem; romberg is negative   DIAGNOSTIC DATA (LABS, IMAGING, TESTING) - I reviewed patient records, labs, notes, testing and imaging myself where available.  Lab Results  Component Value Date   WBC 6.3 02/28/2013   HGB 12.8* 02/28/2013   HCT 36.4* 02/28/2013   MCV 92.4 02/28/2013   PLT 195 02/28/2013      Component Value Date/Time   NA 139 02/28/2013 0610   K 4.1 02/28/2013 0610   CL 102 02/28/2013 0610   CO2 26 02/28/2013 0610   GLUCOSE 125* 02/28/2013 0610   BUN 17 02/28/2013 0610   CREATININE 1.01 02/28/2013 0610   CALCIUM 8.6 02/28/2013 0610   PROT 7.0 02/27/2013 1505   ALBUMIN 3.7 02/27/2013 1505   AST 22 02/27/2013 1505   ALT 15 02/27/2013 1505   ALKPHOS 77 02/27/2013 1505   BILITOT 0.4 02/27/2013 1505   GFRNONAA 66* 02/28/2013 0610   GFRAA 77* 02/28/2013 0610   No results found for this basename: CHOL, HDL, LDLCALC, LDLDIRECT, TRIG, CHOLHDL   No results found for this basename: HGBA1C   No results found for this basename: VITAMINB12   No results found for this basename: TSH   03/29/13 MRI brain - mild atrophy, mild chronic small vessel ischemic disease,  mineralization of basal ganglia   ASSESSMENT AND PLAN  79 y.o. year old male  has a past medical history of Thyroid disease and Melanoma. here with unprovoked syncopal event on 02/27/2013. History and exam also suggest some mild cognitive impairment versus mild dementia.   According to [facility] law, patient should not drive until event free x6 months for this unprovoked syncopal event. I will check EEG to evaluate for possible seizure.   Suanne Marker, MD 04/02/2013, 9:36 AM Certified in Neurology, Neurophysiology and Neuroimaging  [facility] [address] [place], [place] [address] [phone]

## 2013-04-02 NOTE — Patient Instructions (Signed)
I think you had an unprovoked episode of syncope (passing out). As per [facility], you should not drive a motor vehicle until you are event free x6 months. Also avoid climbing on ladders or swimming alone. I will check EEG (brain wave test). We may consider a sleep study in the future.

## 2013-04-05 ENCOUNTER — Other Ambulatory Visit: Payer: Medicare Other

## 2013-04-07 ENCOUNTER — Ambulatory Visit (INDEPENDENT_AMBULATORY_CARE_PROVIDER_SITE_OTHER): Payer: Medicare Other

## 2013-04-07 DIAGNOSIS — R55 Syncope and collapse: Secondary | ICD-10-CM | POA: Diagnosis not present

## 2013-04-07 NOTE — Discharge Summary (Signed)
Addendum  Patient seen and examined, chart and data base reviewed.  I agree with the above assessment and discharge plan.  For full details please see Mrs. Algis Downs PA note.   Clint Lipps, MD [facility] Pager: [phone] 04/07/2013, 1:40 PM

## 2013-04-16 NOTE — Procedures (Signed)
   [facility]  EEG (ELECTROENCEPHALOGRAM) REPORT   STUDY DATE: 04/07/13 PATIENT NAME: Dominic Garcia DOB: 10/31/1926 MRN: [id number]  ORDERING CLINICIAN: Joycelyn Schmid, MD   TECHNOLOGIST: Gearldine Shown TECHNIQUE: Electroencephalogram was recorded utilizing standard 10-20 system of lead placement and reformatted into average and bipolar montages.  RECORDING TIME: 23 minutes ACTIVATION: hyperventilation and photic stimulation  CLINICAL INFORMATION: 79 year old male with syncope.  FINDINGS: Background rhythms of 11-12 hertz and 20-30 microvolts. No focal, lateralizing, epileptiform activity or seizures are seen. Patient recorded in the awake state.   IMPRESSION:  Normal EEG in the awake state.   INTERPRETING PHYSICIAN:  Suanne Marker, MD Certified in Neurology, Neurophysiology and Neuroimaging  [facility] [address] [place], [place] [address] [phone]

## 2013-04-26 DIAGNOSIS — R55 Syncope and collapse: Secondary | ICD-10-CM | POA: Diagnosis not present

## 2013-04-28 NOTE — Progress Notes (Signed)
Quick Note:  I called and spoke with the patient concerning his EEG was normal. ______

## 2013-06-04 DIAGNOSIS — Z136 Encounter for screening for cardiovascular disorders: Secondary | ICD-10-CM | POA: Diagnosis not present

## 2013-06-04 DIAGNOSIS — M25569 Pain in unspecified knee: Secondary | ICD-10-CM | POA: Diagnosis not present

## 2013-06-09 DIAGNOSIS — M25569 Pain in unspecified knee: Secondary | ICD-10-CM | POA: Diagnosis not present

## 2013-06-17 DIAGNOSIS — M25569 Pain in unspecified knee: Secondary | ICD-10-CM | POA: Diagnosis not present

## 2013-07-01 DIAGNOSIS — M1710 Unilateral primary osteoarthritis, unspecified knee: Secondary | ICD-10-CM | POA: Diagnosis not present

## 2013-07-01 DIAGNOSIS — M25569 Pain in unspecified knee: Secondary | ICD-10-CM | POA: Diagnosis not present

## 2013-07-23 DIAGNOSIS — Z85820 Personal history of malignant melanoma of skin: Secondary | ICD-10-CM | POA: Diagnosis not present

## 2013-08-24 DIAGNOSIS — Z23 Encounter for immunization: Secondary | ICD-10-CM | POA: Diagnosis not present

## 2013-10-19 ENCOUNTER — Encounter (INDEPENDENT_AMBULATORY_CARE_PROVIDER_SITE_OTHER): Payer: Self-pay

## 2013-10-19 ENCOUNTER — Other Ambulatory Visit: Payer: Self-pay | Admitting: Dermatology

## 2013-10-19 ENCOUNTER — Ambulatory Visit (INDEPENDENT_AMBULATORY_CARE_PROVIDER_SITE_OTHER): Payer: Medicare Other | Admitting: Diagnostic Neuroimaging

## 2013-10-19 ENCOUNTER — Encounter: Payer: Self-pay | Admitting: Diagnostic Neuroimaging

## 2013-10-19 VITALS — BP 102/55 | HR 70 | Temp 97.7°F | Ht 66.0 in | Wt 153.5 lb

## 2013-10-19 DIAGNOSIS — R55 Syncope and collapse: Secondary | ICD-10-CM | POA: Diagnosis not present

## 2013-10-19 DIAGNOSIS — D485 Neoplasm of uncertain behavior of skin: Secondary | ICD-10-CM | POA: Diagnosis not present

## 2013-10-19 DIAGNOSIS — L820 Inflamed seborrheic keratosis: Secondary | ICD-10-CM | POA: Diagnosis not present

## 2013-10-19 NOTE — Progress Notes (Signed)
[facility]  PATIENT: Dominic Garcia DOB: 03-29-1926  REFERRING CLINICIAN:  HISTORY FROM: patient and wife REASON FOR VISIT: follow up    HISTORICAL  CHIEF COMPLAINT:  Chief Complaint  Patient presents with  . Follow-up    syncope    HISTORY OF PRESENT ILLNESS:   UPDATE 10/19/13: Since last visit, doing well. No further syncope events. Wife is here today for appt. She acknowledges that patient is having more memory problems over last 1 year. He is back to driving.  PRIOR HPI (04/02/13): 79 year old right-handed male with hypothyroidism, melanoma, here for evaluation of passing out episode.  Patient got lost while coming to the appointment. He then delayed the beginning of the appointment because he insisted on going to the car to bring an allergy medication yet been taking. Patient repeated himself several times throughout our examination. Unfortunately his wife did not come to this office visit to provide collateral history. I reviewed the hospital records, referring PCP notes, imaging, labs myself.  In summary, patient is here for evaluation of unprovoked syncopal event on 02/27/2013. That morning he woke up with a sore throat and was not feeling well. He had been climbing on a ladder fixing a ceiling fan and had come down off of the ladder. He was standing next to the ladder when without warning, prodromal dizziness or lightheadedness, he passed out and collapsed to the ground. His wife helped him into a chair and then he fell off again. No tongue biting or incontinence. Unclear if there were convulsions or not.  REVIEW OF SYSTEMS: Full 14 system review of systems performed and notable only for hearing loss snoring erectile dysfunction.  ALLERGIES: Allergies  Allergen Reactions  . Mucinex [Guaifenesin Er] Swelling and Rash    HOME MEDICATIONS: Outpatient Prescriptions Prior to Visit  Medication Sig Dispense Refill  . aspirin EC 81 MG tablet Take 81  mg by mouth 3 (three) times a week.      . Glucosamine-Chondroitin (OSTEO BI-FLEX REGULAR STRENGTH) 250-200 MG TABS Take 1 capsule by mouth 2 (two) times daily.      Marland Kitchen levothyroxine (SYNTHROID, LEVOTHROID) 75 MCG tablet Take 75 mcg by mouth daily.      Marland Kitchen loratadine (CLARITIN) 10 MG tablet Take 1 tablet (10 mg total) by mouth daily.  30 tablet  0  . Multiple Vitamin (MULTIVITAMIN WITH MINERALS) TABS Take 1 tablet by mouth daily.      . Multiple Vitamins-Minerals (ICAPS PO) Take 1 capsule by mouth every morning.       . vitamin C (ASCORBIC ACID) 500 MG tablet Take 500 mg by mouth daily.      . fluticasone (FLONASE) 50 MCG/ACT nasal spray Place 2 sprays into the nose daily.  16 g  3   No facility-administered medications prior to visit.    PAST MEDICAL HISTORY: Past Medical History  Diagnosis Date  . Thyroid disease   . Melanoma     treated at [facility]    PAST SURGICAL HISTORY: Past Surgical History  Procedure Laterality Date  . Appendectomy    . Tonsillectomy    . Cholecystectomy    . Melanoma excision      arm    FAMILY HISTORY: Family History  Problem Relation Age of Onset  . Renal Disease Father   . Aneurysm Father     SOCIAL HISTORY:  History   Social History  . Marital Status: Married    Spouse Name: Austin Miles    Number  of Children: 3  . Years of Education: college gr   Occupational History  . other     [employer]   Social History Main Topics  . Smoking status: Former Smoker -- 0.50 packs/day for 10 years    Quit date: 02/27/1953  . Smokeless tobacco: Never Used  . Alcohol Use: No     Comment: Wine, occasionally  . Drug Use: No  . Sexual Activity: Not on file   Other Topics Concern  . Not on file   Social History Narrative      Pt lives at home with his spouse.   Caffeine Use- Does not consume.     PHYSICAL EXAM  Filed Vitals:   10/19/13 1503  BP: 102/55  Pulse: 70  Temp: 97.7 F (36.5 C)  TempSrc: Oral  Height: 5\' 6"   (1.676 m)  Weight: 153 lb 8 oz (69.627 kg)   Body mass index is 24.79 kg/(m^2).  GENERAL EXAM: Patient is in no distress  CARDIOVASCULAR: Regular rate and rhythm, no murmurs, no carotid bruits  NEUROLOGIC: MENTAL STATUS: awake, alert, language fluent, comprehension intact, naming intact; CALM, RATIONAL, LOGICAL.  CRANIAL NERVE: pupils equal and reactive to light, visual fields full to confrontation, extraocular muscles intact, no nystagmus, facial sensation and strength symmetric, uvula midline, shoulder shrug symmetric, tongue midline. MOTOR: normal bulk and tone, full strength in the BUE, BLE SENSORY: normal and symmetric to light touch, pinprick, temperature, vibration COORDINATION: finger-nose-finger, fine finger movements normal REFLEXES: deep tendon reflexes present and symmetric GAIT/STATION: narrow based gait; able to walk on toes, heels and tandem; romberg is negative   DIAGNOSTIC DATA (LABS, IMAGING, TESTING) - I reviewed patient records, labs, notes, testing and imaging myself where available.  Lab Results  Component Value Date   WBC 6.3 02/28/2013   HGB 12.8* 02/28/2013   HCT 36.4* 02/28/2013   MCV 92.4 02/28/2013   PLT 195 02/28/2013      Component Value Date/Time   NA 139 02/28/2013 0610   K 4.1 02/28/2013 0610   CL 102 02/28/2013 0610   CO2 26 02/28/2013 0610   GLUCOSE 125* 02/28/2013 0610   BUN 17 02/28/2013 0610   CREATININE 1.01 02/28/2013 0610   CALCIUM 8.6 02/28/2013 0610   PROT 7.0 02/27/2013 1505   ALBUMIN 3.7 02/27/2013 1505   AST 22 02/27/2013 1505   ALT 15 02/27/2013 1505   ALKPHOS 77 02/27/2013 1505   BILITOT 0.4 02/27/2013 1505   GFRNONAA 66* 02/28/2013 0610   GFRAA 77* 02/28/2013 0610   No results found for this basename: CHOL,  HDL,  LDLCALC,  LDLDIRECT,  TRIG,  CHOLHDL   No results found for this basename: HGBA1C   No results found for this basename: VITAMINB12   No results found for this basename: TSH   03/29/13 MRI brain - mild atrophy, mild chronic small vessel  ischemic disease, mineralization of basal ganglia  04/16/13 EEG - normal    ASSESSMENT AND PLAN  79 y.o. year old male  has a past medical history of Thyroid disease and Melanoma. here with unprovoked syncopal event on 02/27/2013, unclear etiology. History and exam also suggest some mild cognitive impairment, but doing better today than initial visit.  PLAN: 1. Observation  Return if symptoms worsen or fail to improve, for return to PCP.    Suanne Marker, MD 10/19/2013, 4:36 PM Certified in Neurology, Neurophysiology and Neuroimaging  [facility] [address] [place], [place] [address] [phone]

## 2013-11-05 DIAGNOSIS — R0609 Other forms of dyspnea: Secondary | ICD-10-CM | POA: Diagnosis not present

## 2013-11-11 DIAGNOSIS — H6120 Impacted cerumen, unspecified ear: Secondary | ICD-10-CM | POA: Diagnosis not present

## 2013-11-25 DIAGNOSIS — H6120 Impacted cerumen, unspecified ear: Secondary | ICD-10-CM | POA: Diagnosis not present

## 2013-12-29 DIAGNOSIS — D239 Other benign neoplasm of skin, unspecified: Secondary | ICD-10-CM | POA: Diagnosis not present

## 2014-04-06 DIAGNOSIS — Z23 Encounter for immunization: Secondary | ICD-10-CM | POA: Diagnosis not present

## 2014-04-06 DIAGNOSIS — Z79899 Other long term (current) drug therapy: Secondary | ICD-10-CM | POA: Diagnosis not present

## 2014-04-06 DIAGNOSIS — E039 Hypothyroidism, unspecified: Secondary | ICD-10-CM | POA: Diagnosis not present

## 2014-04-06 DIAGNOSIS — R413 Other amnesia: Secondary | ICD-10-CM | POA: Diagnosis not present

## 2014-06-11 DIAGNOSIS — L0291 Cutaneous abscess, unspecified: Secondary | ICD-10-CM | POA: Diagnosis not present

## 2014-06-11 DIAGNOSIS — L0390 Cellulitis, unspecified: Secondary | ICD-10-CM | POA: Diagnosis not present

## 2014-06-11 DIAGNOSIS — W57XXXA Bitten or stung by nonvenomous insect and other nonvenomous arthropods, initial encounter: Secondary | ICD-10-CM | POA: Diagnosis not present

## 2014-06-11 DIAGNOSIS — T148 Other injury of unspecified body region: Secondary | ICD-10-CM | POA: Diagnosis not present

## 2014-07-12 DIAGNOSIS — H9190 Unspecified hearing loss, unspecified ear: Secondary | ICD-10-CM | POA: Diagnosis not present

## 2014-07-29 DIAGNOSIS — Z85820 Personal history of malignant melanoma of skin: Secondary | ICD-10-CM | POA: Diagnosis not present

## 2014-09-02 IMAGING — CR DG TOE GREAT 2+V*R*
3 series · 3 of 3 positions shown · non-contrast
Comparison: None.

CLINICAL DATA: Fall with right great toe pain.

RIGHT GREAT TOE

[x toes ap right]
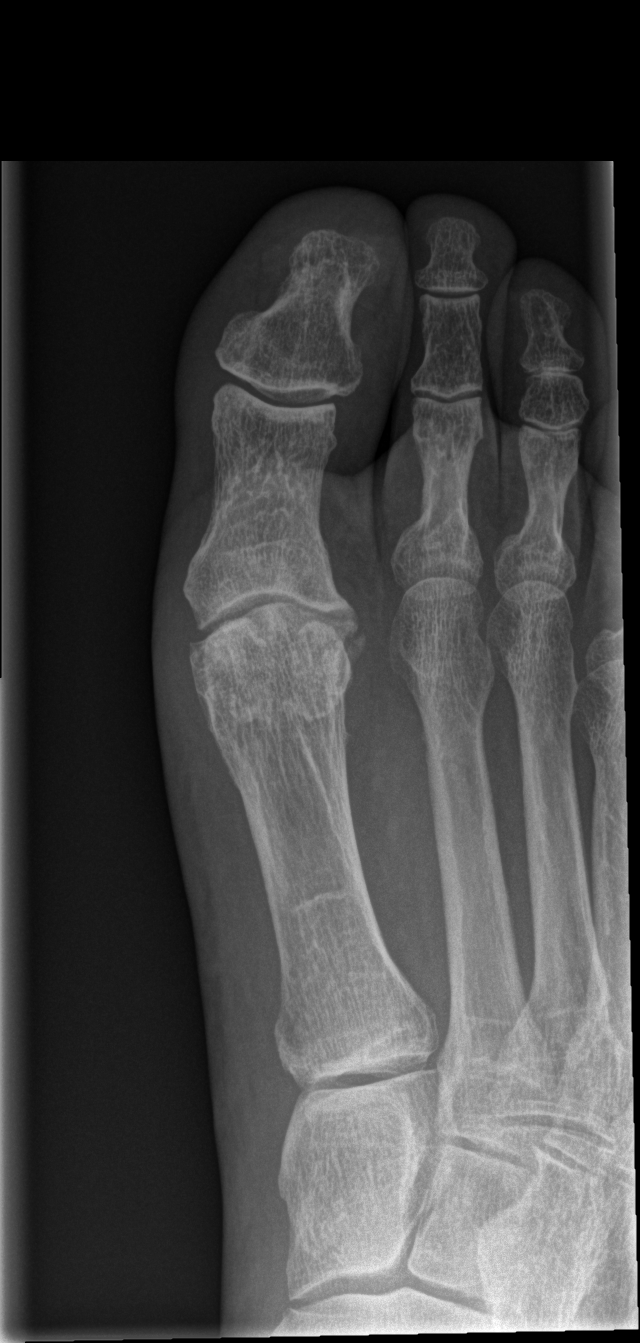

[x toes obl right]
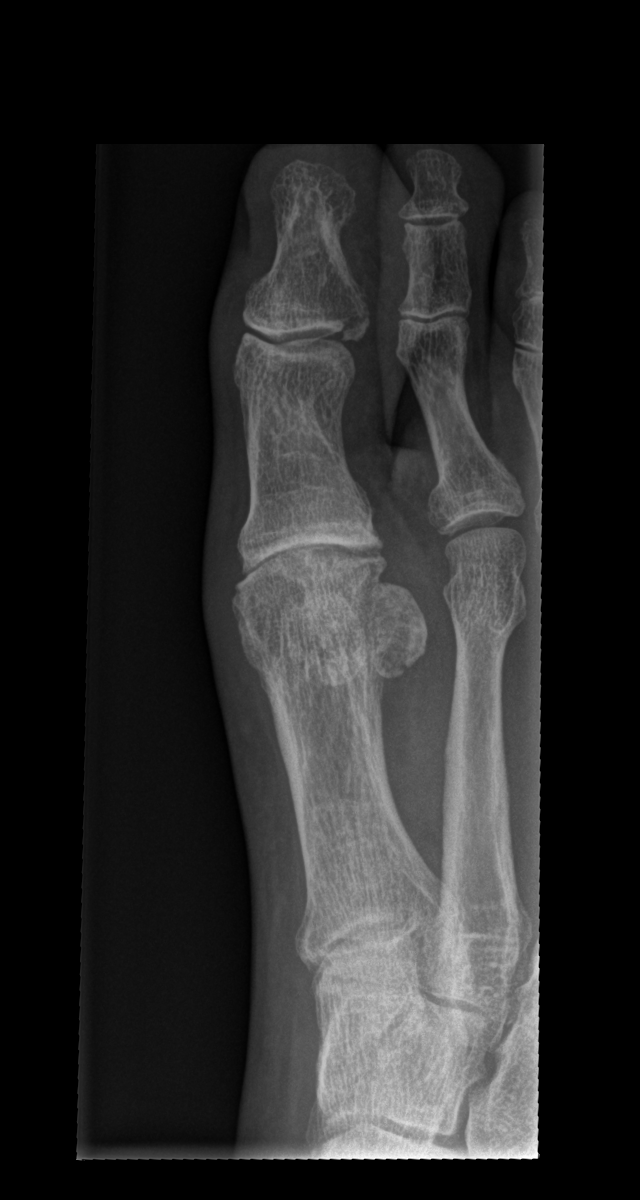

[x toes lat right]
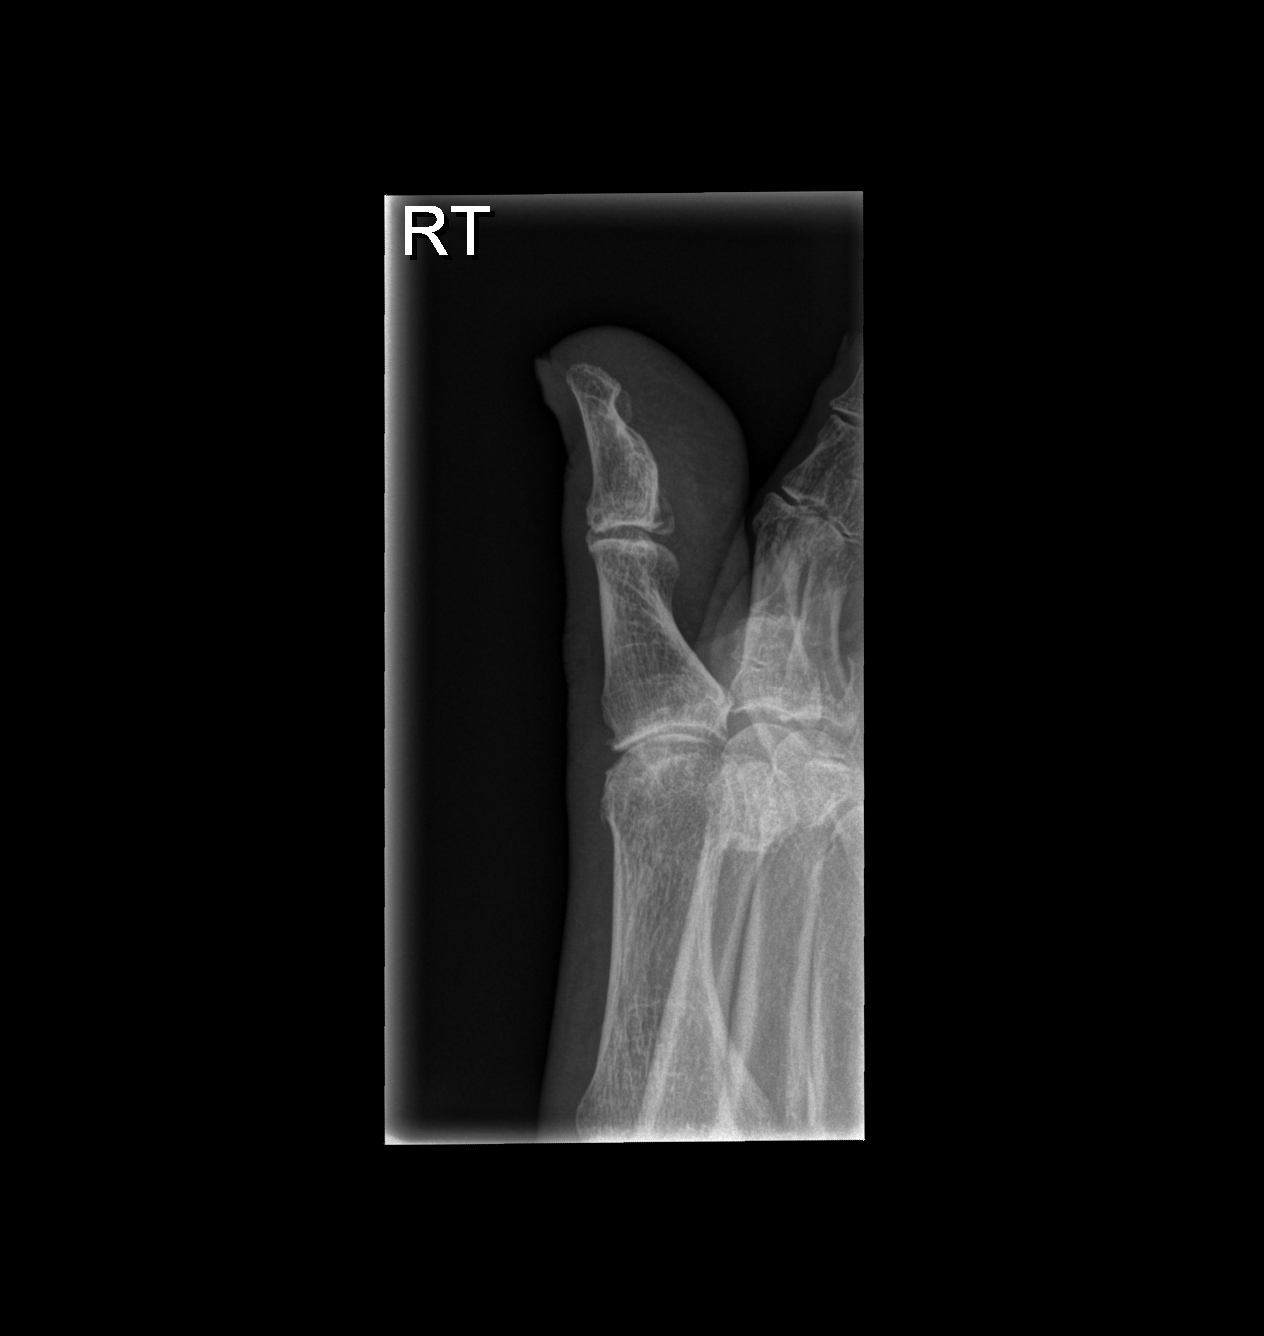

[3 of 3 positions shown; findings below may reference images not displayed]

FINDINGS: Degenerative changes of the first metatarsal phalangeal
joint.  An oblique fracture involves the lateral and plantar
surface of the proximal most aspect of the distal phalanx of the
great toe.  This is intra-articular, and minimally displaced.
IMPRESSION: Distal phalangeal fracture, great toe.

## 2014-09-02 IMAGING — CR DG CHEST 2V
2 series · 2 of 2 positions shown · non-contrast
Comparison: 12/11/2005

CLINICAL DATA: Loss of consciousness.  Fall.History of melanoma.

CHEST - 2 VIEW

[w chest lat]
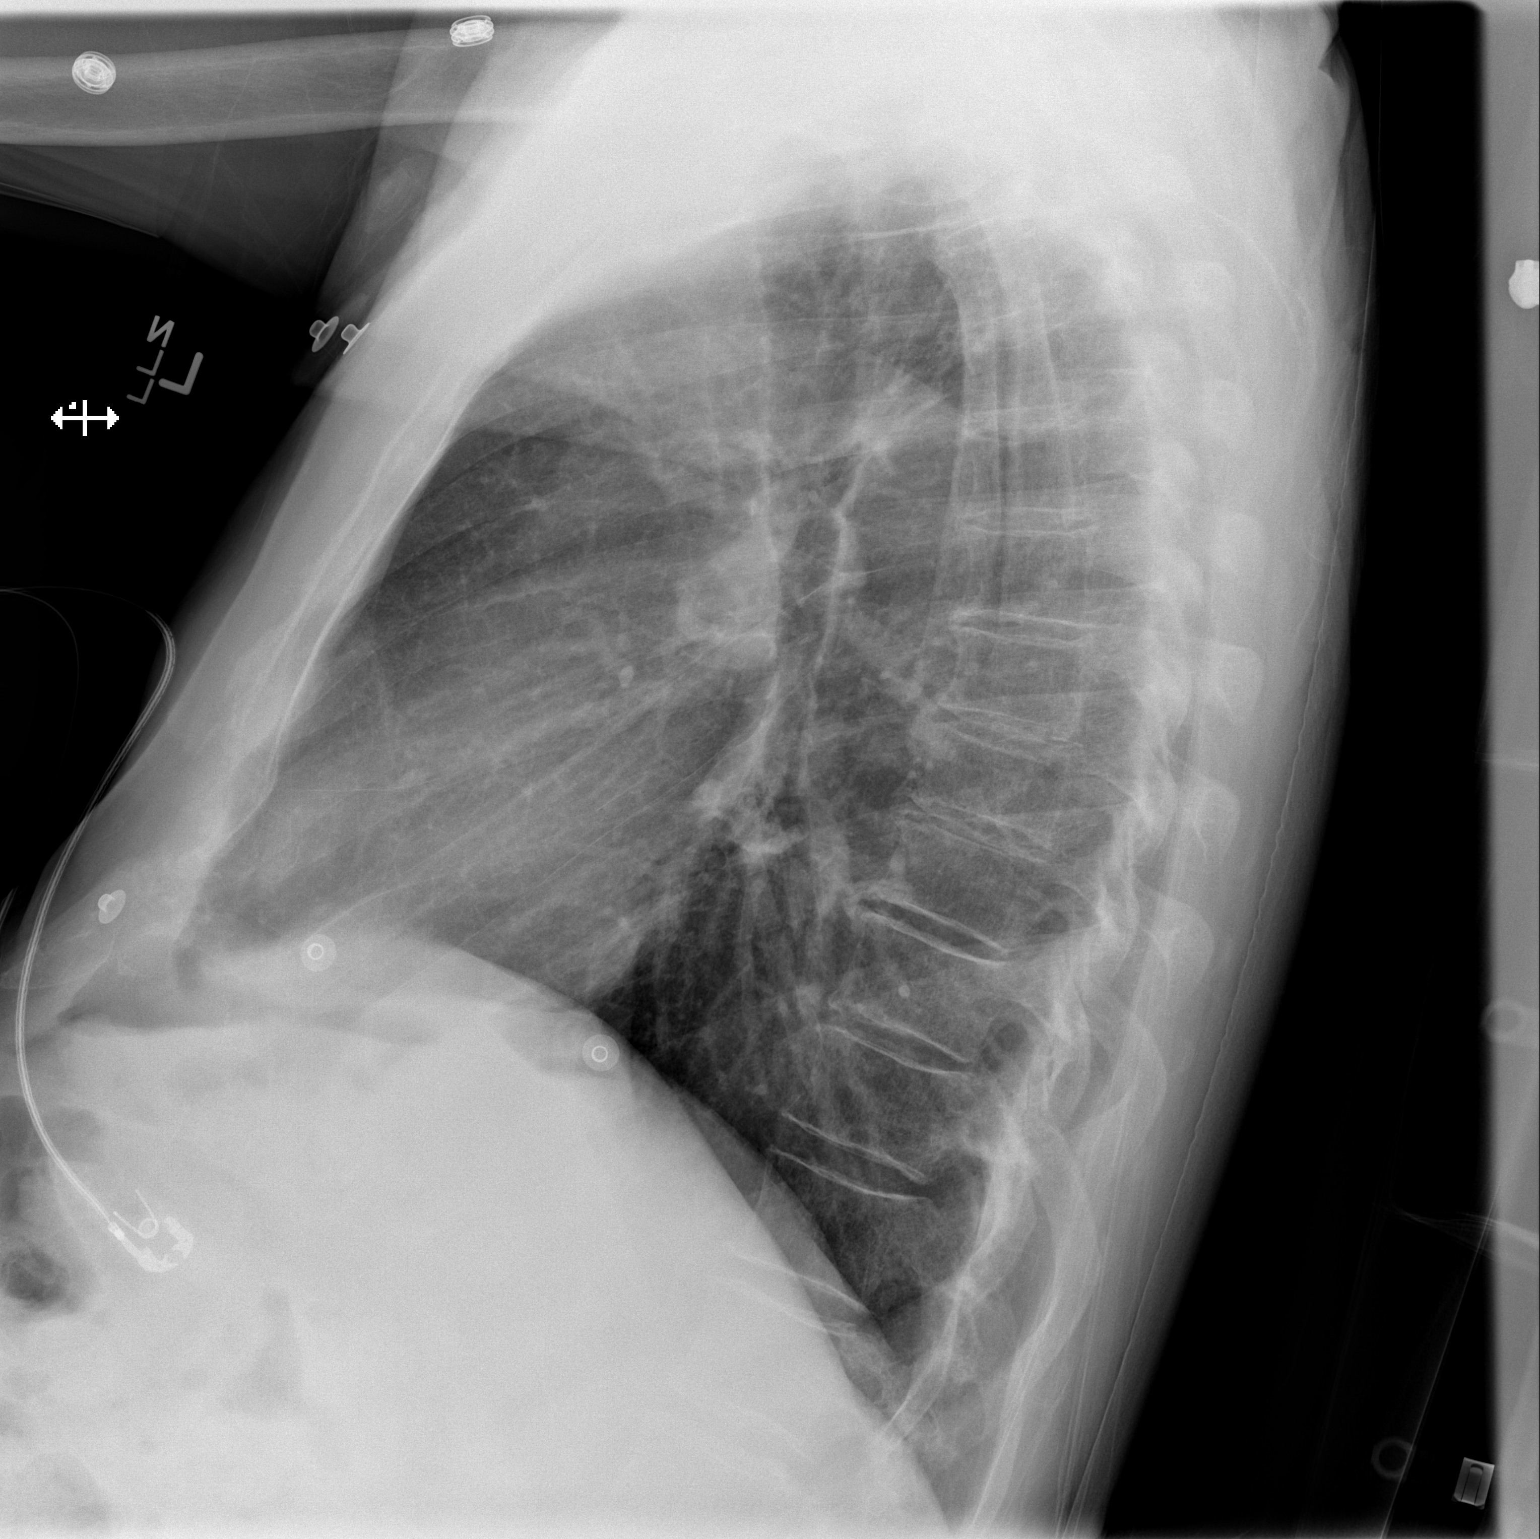

[x chest ap]
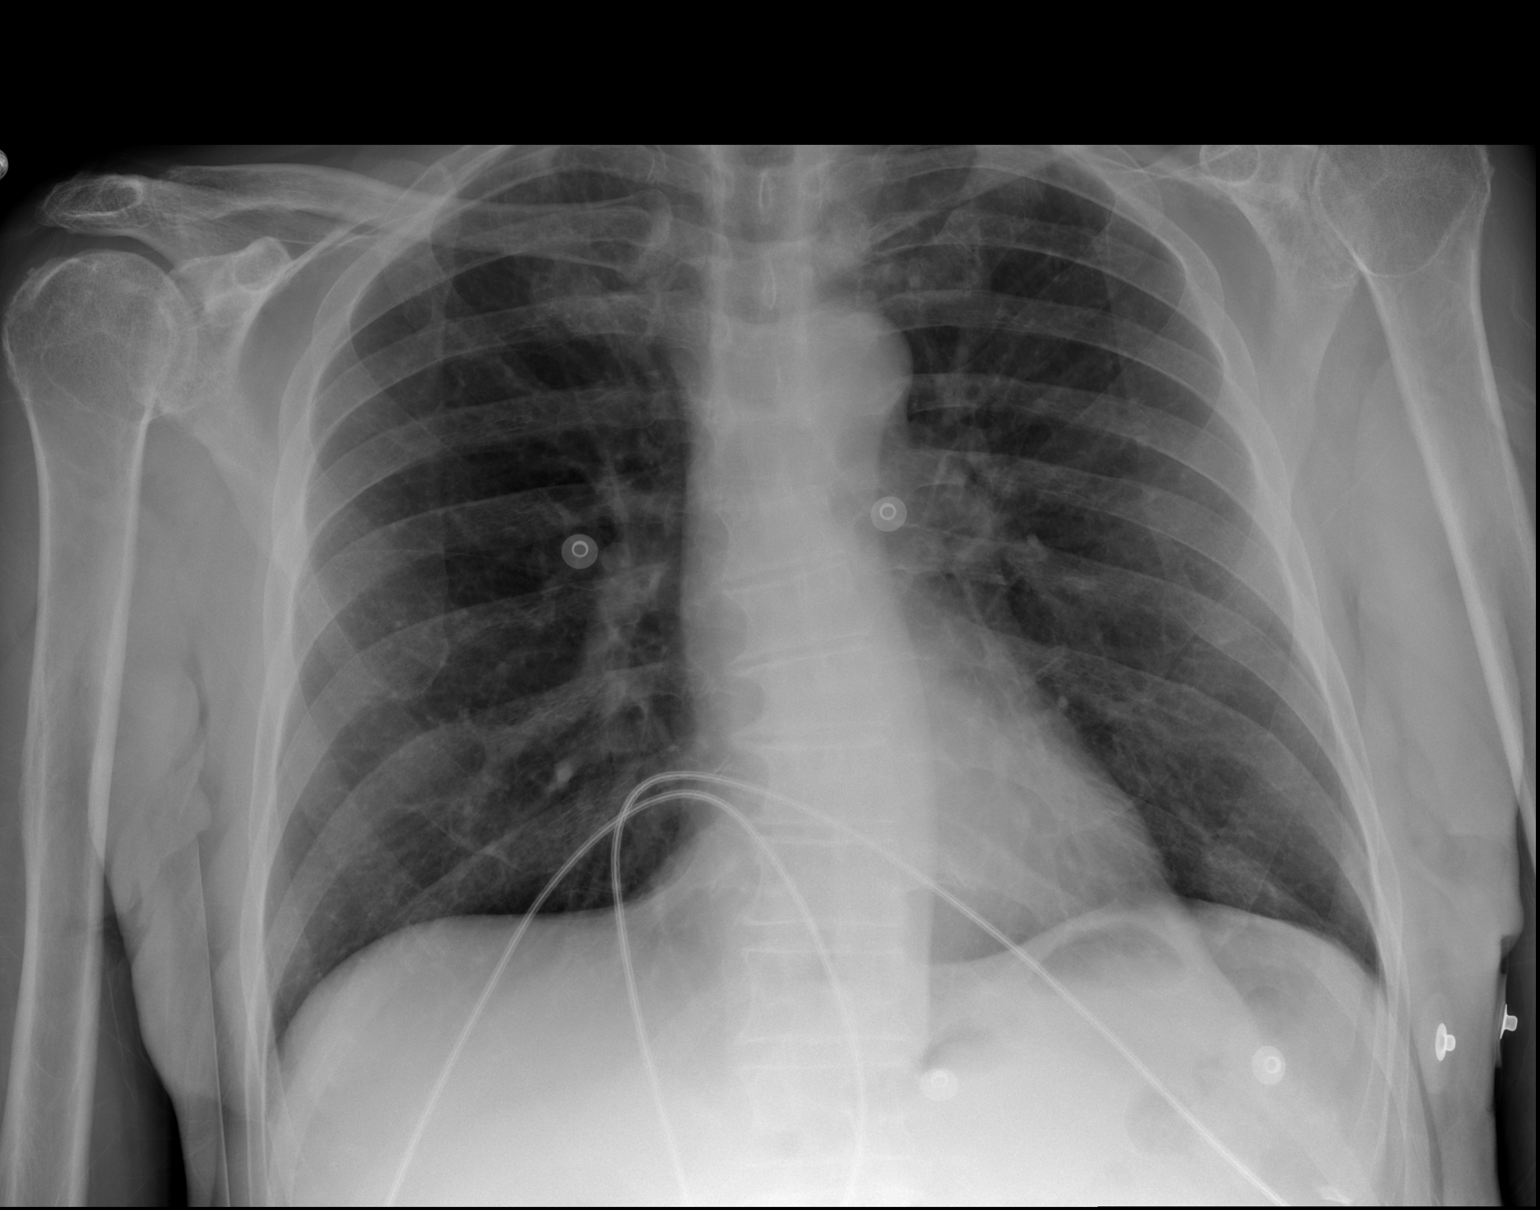

[2 of 2 positions shown; findings below may reference images not displayed]

FINDINGS: Mild S-shaped thoracolumbar spine curvature.Normal heart
size and mediastinal contours. No pleural effusion or pneumothorax.
Suspect mild scarring at the left lung base versus osseous
summation shadow.
IMPRESSION: No acute cardiopulmonary disease.

## 2014-09-03 ENCOUNTER — Emergency Department (HOSPITAL_COMMUNITY)
Admission: EM | Admit: 2014-09-03 | Discharge: 2014-09-04 | Disposition: A | Discharge status: Home / Self Care | Payer: Medicare Other | Attending: Emergency Medicine | Admitting: Emergency Medicine

## 2014-09-03 ENCOUNTER — Encounter (HOSPITAL_COMMUNITY): Payer: Self-pay | Admitting: Emergency Medicine

## 2014-09-03 DIAGNOSIS — Z79899 Other long term (current) drug therapy: Secondary | ICD-10-CM | POA: Diagnosis not present

## 2014-09-03 DIAGNOSIS — E079 Disorder of thyroid, unspecified: Secondary | ICD-10-CM | POA: Diagnosis not present

## 2014-09-03 DIAGNOSIS — R32 Unspecified urinary incontinence: Secondary | ICD-10-CM | POA: Diagnosis not present

## 2014-09-03 DIAGNOSIS — Z87891 Personal history of nicotine dependence: Secondary | ICD-10-CM | POA: Insufficient documentation

## 2014-09-03 DIAGNOSIS — N390 Urinary tract infection, site not specified: Secondary | ICD-10-CM

## 2014-09-03 DIAGNOSIS — Z792 Long term (current) use of antibiotics: Secondary | ICD-10-CM | POA: Insufficient documentation

## 2014-09-03 DIAGNOSIS — Z7982 Long term (current) use of aspirin: Secondary | ICD-10-CM | POA: Insufficient documentation

## 2014-09-03 DIAGNOSIS — Z85820 Personal history of malignant melanoma of skin: Secondary | ICD-10-CM | POA: Diagnosis not present

## 2014-09-03 LAB — URINALYSIS, ROUTINE W REFLEX MICROSCOPIC
Bilirubin Urine: NEGATIVE
Glucose, UA: NEGATIVE mg/dL
Ketones, ur: NEGATIVE mg/dL
Nitrite: NEGATIVE
Protein, ur: NEGATIVE mg/dL
Specific Gravity, Urine: 1.02 (ref 1.005–1.030)
Urobilinogen, UA: 0.2 mg/dL (ref 0.0–1.0)
pH: 6.5 (ref 5.0–8.0)

## 2014-09-03 LAB — BASIC METABOLIC PANEL
Anion gap: 10 (ref 5–15)
BUN: 20 mg/dL (ref 6–23)
CO2: 27 mEq/L (ref 19–32)
Calcium: 9 mg/dL (ref 8.4–10.5)
Chloride: 101 mEq/L (ref 96–112)
Creatinine, Ser: 1.13 mg/dL (ref 0.50–1.35)
GFR calc Af Amer: 66 mL/min — ABNORMAL LOW (ref >90)
GFR calc non Af Amer: 57 mL/min — ABNORMAL LOW (ref >90)
Glucose, Bld: 98 mg/dL (ref 70–99)
Potassium: 4.5 mEq/L (ref 3.7–5.3)
Sodium: 138 mEq/L (ref 137–147)

## 2014-09-03 LAB — CBC
HCT: 39.5 % (ref 39.0–52.0)
Hemoglobin: 13.7 g/dL (ref 13.0–17.0)
MCH: 32.2 pg (ref 26.0–34.0)
MCHC: 34.7 g/dL (ref 30.0–36.0)
MCV: 92.9 fL (ref 78.0–100.0)
Platelets: 220 10*3/uL (ref 150–400)
RBC: 4.25 MIL/uL (ref 4.22–5.81)
RDW: 12.5 % (ref 11.5–15.5)
WBC: 9.8 10*3/uL (ref 4.0–10.5)

## 2014-09-03 LAB — URINE MICROSCOPIC-ADD ON

## 2014-09-03 MED ORDER — DEXTROSE 5 % IV SOLN
1.0000 g | Freq: Once | INTRAVENOUS | Status: AC
  Administered 2014-09-04: 1 g via INTRAVENOUS
  Filled 2014-09-03: qty 10

## 2014-09-03 NOTE — ED Notes (Addendum)
Pt reports an sudden onset of urinary incontinence. Pt states he has had problems with his prostate in which he has to urgently go to the restroom, however pt states he has never had a problem with incontinence. Pt reports a burning sensation while voiding. Pt denies abdominal pain, nausea, or emesis.

## 2014-09-03 NOTE — ED Provider Notes (Signed)
CSN: [id number]     Arrival date & time 09/03/14  2239 History   First MD Initiated Contact with Patient 09/03/14 2258     Chief Complaint  Patient presents with  . Urinary Incontinence     (Consider location/radiation/quality/duration/timing/severity/associated sxs/prior Treatment) HPI  This is an 80 yo male with history significant for BPH who presents with urinary incontinence.  Patient reports that at baseline he has urinary frequency. However over the last day he has experienced increasing urgency and has had multiple episodes of incontinence. He also reports dysuria. Denies any hematuria, back pain, flank pain. Denies any abdominal pain or fevers. He is not taking any new medications.  Past Medical History  Diagnosis Date  . Thyroid disease   . Melanoma     treated at [facility]   Past Surgical History  Procedure Laterality Date  . Appendectomy    . Tonsillectomy    . Cholecystectomy    . Melanoma excision      arm   Family History  Problem Relation Age of Onset  . Renal Disease Father   . Aneurysm Father    History  Substance Use Topics  . Smoking status: Former Smoker -- 0.50 packs/day for 10 years    Quit date: 02/27/1953  . Smokeless tobacco: Never Used  . Alcohol Use: No     Comment: Wine, occasionally    Review of Systems  Constitutional: Negative.  Negative for fever.  Respiratory: Negative.  Negative for chest tightness and shortness of breath.   Cardiovascular: Negative.  Negative for chest pain.  Gastrointestinal: Negative.  Negative for nausea, vomiting and abdominal pain.  Genitourinary: Positive for dysuria and urgency. Negative for flank pain.  Musculoskeletal: Negative for back pain.  Skin: Negative for rash.  All other systems reviewed and are negative.     Allergies  Mucinex  Home Medications   Prior to Admission medications   Medication Sig Start Date End Date Taking? Authorizing Provider  aspirin EC 81 MG tablet Take 81 mg by mouth 3  (three) times a week.   Yes Historical Provider, MD  Glucosamine-Chondroitin (OSTEO BI-FLEX REGULAR STRENGTH) 250-200 MG TABS Take 1 capsule by mouth 2 (two) times daily.   Yes Historical Provider, MD  levothyroxine (SYNTHROID, LEVOTHROID) 75 MCG tablet Take 75 mcg by mouth daily.   Yes Historical Provider, MD  loratadine (CLARITIN) 10 MG tablet Take 1 tablet (10 mg total) by mouth daily. 03/01/13  Yes Marianne L York, PA-C  Multiple Vitamin (MULTIVITAMIN WITH MINERALS) TABS Take 1 tablet by mouth daily.   Yes Historical Provider, MD  Multiple Vitamins-Minerals (ICAPS PO) Take 1 capsule by mouth every morning.    Yes Historical Provider, MD  vitamin C (ASCORBIC ACID) 500 MG tablet Take 500 mg by mouth daily.   Yes Historical Provider, MD  cephALEXin (KEFLEX) 500 MG capsule Take 1 capsule (500 mg total) by mouth 2 (two) times daily. 09/04/14   Merryl Hacker, MD   BP 119/53  Pulse 81  Temp(Src) 98.3 F (36.8 C) (Oral)  Resp 18  SpO2 97% Physical Exam  Nursing note and vitals reviewed. Constitutional: He is oriented to person, place, and time. No distress.  Appears younger than stated age  HENT:  Head: Normocephalic and atraumatic.  Cardiovascular: Normal rate, regular rhythm and normal heart sounds.   No murmur heard. Pulmonary/Chest: Effort normal and breath sounds normal. No respiratory distress. He has no wheezes.  Abdominal: Soft. There is no tenderness. There is no rebound  and no guarding.  Musculoskeletal: He exhibits no edema.  Neurological: He is alert and oriented to person, place, and time.  Skin: Skin is warm and dry.  Psychiatric: He has a normal mood and affect.    ED Course  Procedures (including critical care time) Labs Review Labs Reviewed  URINALYSIS, ROUTINE W REFLEX MICROSCOPIC - Abnormal; Notable for the following:    APPearance CLOUDY (*)    Hgb urine dipstick LARGE (*)    Leukocytes, UA MODERATE (*)    All other components within normal limits  BASIC  METABOLIC PANEL - Abnormal; Notable for the following:    GFR calc non Af Amer 57 (*)    GFR calc Af Amer 66 (*)    All other components within normal limits  URINE MICROSCOPIC-ADD ON - Abnormal; Notable for the following:    Bacteria, UA MANY (*)    All other components within normal limits  URINE CULTURE  CBC    Imaging Review No results found.   EKG Interpretation None      MDM   Final diagnoses:  UTI (lower urinary tract infection)    Patient with history of BPH presents with urinary frequency and incontinence. Afebrile. No other associated symptoms. Workup notable for too numerous to count white cells and bacteria in the urine. Urine culture sent. Patient given IV Rocephin. No evidence of leukocytosis or kidney failure.  Will discharge with Keflex.  After history, exam, and medical workup I feel the patient has been appropriately medically screened and is safe for discharge home. Pertinent diagnoses were discussed with the patient. Patient was given return precautions.     Merryl Hacker, MD 09/04/14 [phone]

## 2014-09-04 DIAGNOSIS — N390 Urinary tract infection, site not specified: Secondary | ICD-10-CM | POA: Diagnosis not present

## 2014-09-04 MED ORDER — CEPHALEXIN 500 MG PO CAPS: 500.0000 mg | ORAL_CAPSULE | Freq: Two times a day (BID) | ORAL | Status: DC

## 2014-09-04 NOTE — Discharge Instructions (Signed)

## 2014-09-05 ENCOUNTER — Encounter: Payer: Self-pay | Admitting: General Surgery

## 2014-09-07 LAB — URINE CULTURE: Colony Count: >=100000

## 2014-09-08 ENCOUNTER — Telehealth (HOSPITAL_COMMUNITY): Payer: Self-pay

## 2014-09-08 DIAGNOSIS — R300 Dysuria: Secondary | ICD-10-CM | POA: Diagnosis not present

## 2014-09-08 NOTE — ED Notes (Signed)
Post ED Visit - Positive Culture Follow-up  Culture report reviewed by antimicrobial stewardship pharmacist: []  Wes Dulaney, Pharm.D., BCPS []  Heide Guile, Pharm.D., BCPS []  Alycia Rossetti, Pharm.D., BCPS []  [place], Pharm.D., BCPS, AAHIVP []  Legrand Como, Pharm.D., BCPS, AAHIVP [x]  Carly Sabat, Pharm.D. []  Elenor Quinones, Pharm.D.  Positive urine culture Treated with cephalexin, organism sensitive to the same and no further patient follow-up is required at this time.  Ileene Musa 09/08/2014, 9:23 AM

## 2014-09-14 DIAGNOSIS — Z23 Encounter for immunization: Secondary | ICD-10-CM | POA: Diagnosis not present

## 2014-09-14 DIAGNOSIS — R300 Dysuria: Secondary | ICD-10-CM | POA: Diagnosis not present

## 2014-09-14 DIAGNOSIS — R413 Other amnesia: Secondary | ICD-10-CM | POA: Diagnosis not present

## 2014-09-14 DIAGNOSIS — E039 Hypothyroidism, unspecified: Secondary | ICD-10-CM | POA: Diagnosis not present

## 2014-09-14 DIAGNOSIS — Z79899 Other long term (current) drug therapy: Secondary | ICD-10-CM | POA: Diagnosis not present

## 2014-09-15 DIAGNOSIS — Z23 Encounter for immunization: Secondary | ICD-10-CM | POA: Diagnosis not present

## 2014-11-24 DIAGNOSIS — E039 Hypothyroidism, unspecified: Secondary | ICD-10-CM | POA: Diagnosis not present

## 2015-01-23 ENCOUNTER — Ambulatory Visit (INDEPENDENT_AMBULATORY_CARE_PROVIDER_SITE_OTHER): Payer: Medicare Other

## 2015-01-23 ENCOUNTER — Encounter: Payer: Self-pay | Admitting: Podiatry

## 2015-01-23 ENCOUNTER — Ambulatory Visit (INDEPENDENT_AMBULATORY_CARE_PROVIDER_SITE_OTHER): Payer: Medicare Other | Admitting: Podiatry

## 2015-01-23 VITALS — Ht 67.0 in | Wt 148.0 lb

## 2015-01-23 DIAGNOSIS — S90852A Superficial foreign body, left foot, initial encounter: Secondary | ICD-10-CM | POA: Diagnosis not present

## 2015-01-23 DIAGNOSIS — M79672 Pain in left foot: Secondary | ICD-10-CM

## 2015-01-23 NOTE — Progress Notes (Signed)
   Subjective:    Patient ID: Dominic Garcia, male    DOB: [birth year], 81 y.o.   MRN: [id number]  HPI Comments: N foot pain and discoloration L left plantar heel D 1 month O none C painful, darkened spot and fissure A weight bearing T balm  Foot Pain   Patient denies any direct injury to the left foot, however, he does walk without shoes at times. He has discomfort in the plantar lateral left heel area with with weightbearing   Review of Systems  All other systems reviewed and are negative.      Objective:   Physical Exam  Orientated 3  Vascular: DP pulses 2/4 bilaterally PT pulses 2/4 bilaterally Capillary reflex immediate bilaterally  Neurological: Sensation to 10 g monofilament wire intact 5/5 bilaterally Vibratory sensation intact bilaterally Ankle reflex equal and reactive bilaterally  Dermatological: Punctate darkened area plantar lateral left heel is tender to pressure which duplicates area of discomfort There is no erythema, edema or active drainage around the skin lesion on the plantar left heel area  Musculoskeletal: Patient has stable gait without any obvious limping No restriction ankle, subtalar, midtarsal joints bilaterally  X-ray examination weightbearing left foot  Intact bony structure without a fracture and/or dislocation Circular wire placed on lateral heel with history of possible foreign body Oblique view demonstrates 4.5 mm linear object within circled area Calcaneal axial view demonstrates 4.6 mm linear object within circled area  Radiographic impression: Suspect foreign body superficial aspect of left heel          Assessment & Plan:   Assessment: Superficial foreign body plantar aspect of left heel  Plan: Skin is prepped with Betadine Debridement of this punctate area on the plantar left heel releases a small amount of purulent drainage and a  4 mm x 0.5 mm linear sharp wire-like object was retrieved from the area. No  further foreign bodies were noted. An antibiotic dressing was applied to the left heel area.  Patient was advised to apply topical antibiotic ointment and Band-Aid to the left heel daily until healed Return if he has any further concerns

## 2015-01-23 NOTE — Patient Instructions (Signed)
Leave Bandage on left foot 24 hours Apply topical antibiotic ointment and Band-Aid to the bottom left heel daily until healed

## 2015-01-24 ENCOUNTER — Encounter: Payer: Self-pay | Admitting: Podiatry

## 2015-02-11 DIAGNOSIS — R05 Cough: Secondary | ICD-10-CM | POA: Diagnosis not present

## 2015-02-13 DIAGNOSIS — R0989 Other specified symptoms and signs involving the circulatory and respiratory systems: Secondary | ICD-10-CM | POA: Diagnosis not present

## 2015-02-14 DIAGNOSIS — J370 Chronic laryngitis: Secondary | ICD-10-CM | POA: Diagnosis not present

## 2015-02-14 DIAGNOSIS — J309 Allergic rhinitis, unspecified: Secondary | ICD-10-CM | POA: Diagnosis not present

## 2015-03-07 DIAGNOSIS — J309 Allergic rhinitis, unspecified: Secondary | ICD-10-CM | POA: Diagnosis not present

## 2015-03-07 DIAGNOSIS — J370 Chronic laryngitis: Secondary | ICD-10-CM | POA: Diagnosis not present

## 2015-03-16 DIAGNOSIS — C439 Malignant melanoma of skin, unspecified: Secondary | ICD-10-CM | POA: Diagnosis not present

## 2015-03-16 DIAGNOSIS — K219 Gastro-esophageal reflux disease without esophagitis: Secondary | ICD-10-CM | POA: Diagnosis not present

## 2015-03-16 DIAGNOSIS — E782 Mixed hyperlipidemia: Secondary | ICD-10-CM | POA: Diagnosis not present

## 2015-03-16 DIAGNOSIS — N400 Enlarged prostate without lower urinary tract symptoms: Secondary | ICD-10-CM | POA: Diagnosis not present

## 2015-03-16 DIAGNOSIS — Z0000 Encounter for general adult medical examination without abnormal findings: Secondary | ICD-10-CM | POA: Diagnosis not present

## 2015-03-16 DIAGNOSIS — E039 Hypothyroidism, unspecified: Secondary | ICD-10-CM | POA: Diagnosis not present

## 2015-03-16 DIAGNOSIS — Z79899 Other long term (current) drug therapy: Secondary | ICD-10-CM | POA: Diagnosis not present

## 2015-03-16 DIAGNOSIS — J309 Allergic rhinitis, unspecified: Secondary | ICD-10-CM | POA: Diagnosis not present

## 2015-03-20 DIAGNOSIS — Z0000 Encounter for general adult medical examination without abnormal findings: Secondary | ICD-10-CM | POA: Diagnosis not present

## 2015-03-20 DIAGNOSIS — E039 Hypothyroidism, unspecified: Secondary | ICD-10-CM | POA: Diagnosis not present

## 2015-03-20 DIAGNOSIS — C439 Malignant melanoma of skin, unspecified: Secondary | ICD-10-CM | POA: Diagnosis not present

## 2015-03-20 DIAGNOSIS — Z79899 Other long term (current) drug therapy: Secondary | ICD-10-CM | POA: Diagnosis not present

## 2015-03-20 DIAGNOSIS — J309 Allergic rhinitis, unspecified: Secondary | ICD-10-CM | POA: Diagnosis not present

## 2015-03-20 DIAGNOSIS — E782 Mixed hyperlipidemia: Secondary | ICD-10-CM | POA: Diagnosis not present

## 2015-03-20 DIAGNOSIS — N400 Enlarged prostate without lower urinary tract symptoms: Secondary | ICD-10-CM | POA: Diagnosis not present

## 2015-03-20 DIAGNOSIS — K219 Gastro-esophageal reflux disease without esophagitis: Secondary | ICD-10-CM | POA: Diagnosis not present

## 2015-04-11 DIAGNOSIS — H903 Sensorineural hearing loss, bilateral: Secondary | ICD-10-CM | POA: Diagnosis not present

## 2015-04-11 DIAGNOSIS — H6122 Impacted cerumen, left ear: Secondary | ICD-10-CM | POA: Diagnosis not present

## 2015-04-19 DIAGNOSIS — L570 Actinic keratosis: Secondary | ICD-10-CM | POA: Diagnosis not present

## 2015-04-19 DIAGNOSIS — Z85820 Personal history of malignant melanoma of skin: Secondary | ICD-10-CM | POA: Diagnosis not present

## 2015-04-20 DIAGNOSIS — M5136 Other intervertebral disc degeneration, lumbar region: Secondary | ICD-10-CM | POA: Diagnosis not present

## 2015-04-20 DIAGNOSIS — M79662 Pain in left lower leg: Secondary | ICD-10-CM | POA: Diagnosis not present

## 2015-06-02 DIAGNOSIS — H2511 Age-related nuclear cataract, right eye: Secondary | ICD-10-CM | POA: Diagnosis not present

## 2015-06-02 DIAGNOSIS — H2512 Age-related nuclear cataract, left eye: Secondary | ICD-10-CM | POA: Diagnosis not present

## 2015-06-02 DIAGNOSIS — H3531 Nonexudative age-related macular degeneration: Secondary | ICD-10-CM | POA: Diagnosis not present

## 2015-06-02 DIAGNOSIS — H02839 Dermatochalasis of unspecified eye, unspecified eyelid: Secondary | ICD-10-CM | POA: Diagnosis not present

## 2015-06-09 DIAGNOSIS — J370 Chronic laryngitis: Secondary | ICD-10-CM | POA: Diagnosis not present

## 2015-06-09 DIAGNOSIS — J309 Allergic rhinitis, unspecified: Secondary | ICD-10-CM | POA: Diagnosis not present

## 2015-07-20 DIAGNOSIS — R413 Other amnesia: Secondary | ICD-10-CM | POA: Diagnosis not present

## 2015-07-20 DIAGNOSIS — Z79899 Other long term (current) drug therapy: Secondary | ICD-10-CM | POA: Diagnosis not present

## 2015-07-20 DIAGNOSIS — E039 Hypothyroidism, unspecified: Secondary | ICD-10-CM | POA: Diagnosis not present

## 2015-07-27 ENCOUNTER — Other Ambulatory Visit: Payer: Self-pay | Admitting: Dermatology

## 2015-07-27 DIAGNOSIS — C44229 Squamous cell carcinoma of skin of left ear and external auricular canal: Secondary | ICD-10-CM | POA: Diagnosis not present

## 2015-08-04 DIAGNOSIS — Z85820 Personal history of malignant melanoma of skin: Secondary | ICD-10-CM | POA: Diagnosis not present

## 2015-09-12 DIAGNOSIS — Z23 Encounter for immunization: Secondary | ICD-10-CM | POA: Diagnosis not present

## 2015-09-21 ENCOUNTER — Other Ambulatory Visit: Payer: Self-pay | Admitting: Neurology

## 2015-09-21 DIAGNOSIS — K219 Gastro-esophageal reflux disease without esophagitis: Secondary | ICD-10-CM

## 2015-09-21 MED ORDER — OMEPRAZOLE 40 MG PO CPDR: 40.0000 mg | DELAYED_RELEASE_CAPSULE | Freq: Every day | ORAL | Status: DC

## 2015-09-21 MED ORDER — OMEPRAZOLE 20 MG PO CPDR: 20.0000 mg | DELAYED_RELEASE_CAPSULE | Freq: Every day | ORAL | Status: DC

## 2015-09-21 NOTE — Telephone Encounter (Signed)
Received refill for omeprazole 40mg  ,

## 2015-12-12 ENCOUNTER — Other Ambulatory Visit: Payer: Self-pay | Admitting: Allergy and Immunology

## 2016-01-10 ENCOUNTER — Ambulatory Visit (INDEPENDENT_AMBULATORY_CARE_PROVIDER_SITE_OTHER): Payer: Medicare Other | Admitting: Podiatry

## 2016-01-10 ENCOUNTER — Encounter: Payer: Self-pay | Admitting: Podiatry

## 2016-01-10 VITALS — BP 130/70 | HR 74 | Resp 12

## 2016-01-10 DIAGNOSIS — R234 Changes in skin texture: Secondary | ICD-10-CM

## 2016-01-10 DIAGNOSIS — L570 Actinic keratosis: Secondary | ICD-10-CM | POA: Diagnosis not present

## 2016-01-10 DIAGNOSIS — L989 Disorder of the skin and subcutaneous tissue, unspecified: Secondary | ICD-10-CM

## 2016-01-10 NOTE — Patient Instructions (Signed)
Today your foot examination demonstrated good circulation and good feeling in your feet The back of your right heel has a dry cracked with a scab over it, called a fissure Apply a topical antibiotic ointment once or twice daily and cover with a Band-Aid until healed. After the fissure is healed I would recommend that she moisturizer skin with an all-purpose skin lotion on ongoing daily basis in the heel area

## 2016-01-10 NOTE — Progress Notes (Signed)
   Subjective:    Patient ID: Dominic Garcia, male    DOB: Jul 23, 1926, 82 y.o.   MRN: [id number]  HPI   This patient presents today complaining of a painful posterior right heel for approximately one month that is aggravated with direct pressure and shoe wearing. Patient describes applying a cream to the area and occasionally, however, the lesion has persisted any like to have it evaluated.  Review of Systems  Skin: Positive for color change.       Objective:   Physical Exam  Pleasant orientated 3  Vascular: No peripheral edema bilaterally DP and PT pulses 2/4 bilaterally Capillary reflex immediate bilaterally  Neurological: Sensation to 10 g monofilament wire intact 5/5 bilaterally Vibratory sensation reactive bilaterally Ankle reflex equal and reactive bilaterally  Dermatological: Posterior lateral right heel has a 15 x 3 mm fissure with an eschar covering the fissure. There is no surrounding erythema, edema, warmth, malodor or drainage. The areas tender to direct palpation  Musculoskeletal: There is no crepitus or pain upon range of motion ankle, subtalar, midtarsal joints bilaterally      Assessment & Plan:   Assessment: Satisfactory neurovascular status Noninfected fissure posterior right heel  Plan: Today review the results examination with patient today. I advised him he had a fissure without any clinical sign of infection. I recommended he apply a topical antibiotic ointment 1-2 times daily and cover with a Band-Aid until healed. After healing recommended he apply all-purpose skin lotion  Reappoint at patient's request

## 2016-01-24 ENCOUNTER — Ambulatory Visit: Payer: Medicare Other | Admitting: Podiatry

## 2016-03-19 DIAGNOSIS — H16141 Punctate keratitis, right eye: Secondary | ICD-10-CM | POA: Diagnosis not present

## 2016-03-27 DIAGNOSIS — N400 Enlarged prostate without lower urinary tract symptoms: Secondary | ICD-10-CM | POA: Diagnosis not present

## 2016-03-27 DIAGNOSIS — R413 Other amnesia: Secondary | ICD-10-CM | POA: Diagnosis not present

## 2016-03-27 DIAGNOSIS — Z0000 Encounter for general adult medical examination without abnormal findings: Secondary | ICD-10-CM | POA: Diagnosis not present

## 2016-03-27 DIAGNOSIS — J309 Allergic rhinitis, unspecified: Secondary | ICD-10-CM | POA: Diagnosis not present

## 2016-03-27 DIAGNOSIS — E782 Mixed hyperlipidemia: Secondary | ICD-10-CM | POA: Diagnosis not present

## 2016-03-27 DIAGNOSIS — Z85820 Personal history of malignant melanoma of skin: Secondary | ICD-10-CM | POA: Diagnosis not present

## 2016-03-27 DIAGNOSIS — Z86010 Personal history of colonic polyps: Secondary | ICD-10-CM | POA: Diagnosis not present

## 2016-03-27 DIAGNOSIS — Z79899 Other long term (current) drug therapy: Secondary | ICD-10-CM | POA: Diagnosis not present

## 2016-03-27 DIAGNOSIS — E039 Hypothyroidism, unspecified: Secondary | ICD-10-CM | POA: Diagnosis not present

## 2016-04-11 ENCOUNTER — Encounter: Payer: Self-pay | Admitting: *Deleted

## 2016-04-15 ENCOUNTER — Encounter: Payer: Self-pay | Admitting: Diagnostic Neuroimaging

## 2016-04-15 ENCOUNTER — Ambulatory Visit (INDEPENDENT_AMBULATORY_CARE_PROVIDER_SITE_OTHER): Payer: Medicare Other | Admitting: Diagnostic Neuroimaging

## 2016-04-15 VITALS — BP 93/48 | HR 69 | Ht 67.0 in | Wt 148.6 lb

## 2016-04-15 DIAGNOSIS — F0390 Unspecified dementia without behavioral disturbance: Secondary | ICD-10-CM

## 2016-04-15 DIAGNOSIS — F03A0 Unspecified dementia, mild, without behavioral disturbance, psychotic disturbance, mood disturbance, and anxiety: Secondary | ICD-10-CM

## 2016-04-15 MED ORDER — MEMANTINE HCL 10 MG PO TABS
10.0000 mg | ORAL_TABLET | Freq: Two times a day (BID) | ORAL | Status: DC
Start: 2016-04-15 — End: 2016-08-13

## 2016-04-15 NOTE — Patient Instructions (Signed)
   Thank you for coming to see us at [facility]. I hope we have been able to provide you high quality care today.  You may receive a patient satisfaction survey over the next few weeks. We would appreciate your feedback and comments so that we may continue to improve ourselves and the health of our patients.  - start memantine 10mg at bedtime for 2 weeks; then increase to twice a day  - caution with safety / supervision  - visit alz.org website for more information   ~~~~~~~~~~~~~~~~~~~~~~~~~~~~~~~~~~~~~~~~~~~~~~~~~~~~~~~~~~~~~~~~~  DR. PENUMALLI'S GUIDE TO HAPPY AND HEALTHY LIVING These are some of my general health and wellness recommendations. Some of them may apply to you better than others. Please use common sense as you try these suggestions and feel free to ask me any questions.   ACTIVITY/FITNESS Mental, social, emotional and physical stimulation are very important for brain and body health. Try learning a new activity (arts, music, language, sports, games).  Keep moving your body to the best of your abilities. You can do this at home, inside or outside, the park, community center, gym or anywhere you like. Consider a physical therapist or personal trainer to get started. Consider the app Sworkit. Fitness trackers such as smart-watches, smart-phones or Fitbits can help as well.   NUTRITION Eat more plants: colorful vegetables, nuts, seeds and berries.  Eat less sugar, salt, preservatives and processed foods.  Avoid toxins such as cigarettes and alcohol.  Drink water when you are thirsty. Warm water with a slice of lemon is an excellent morning drink to start the day.  Consider these websites for more information The Nutrition Source (https://www.hsph.[employer].edu/nutritionsource) Precision Nutrition (www.precisionnutrition.com/blog/infographics)   RELAXATION Consider practicing mindfulness meditation or other relaxation techniques such as deep  breathing, prayer, yoga, tai chi, massage. See website mindful.org or the apps Headspace or Calm to help get started.   SLEEP Try to get at least 7-8+ hours sleep per day. Regular exercise and reduced caffeine will help you sleep better. Practice good sleep hygeine techniques. See website sleep.org for more information.   PLANNING Prepare estate planning, living will, healthcare POA documents. Sometimes this is best planned with the help of an attorney. Theconversationproject.org and agingwithdignity.org are excellent resources.  

## 2016-04-15 NOTE — Progress Notes (Signed)
[facility]  PATIENT: Dominic Garcia DOB: [birth year]  REFERRING CLINICIAN:  HISTORY FROM: patient and wife REASON FOR VISIT: follow up    HISTORICAL  CHIEF COMPLAINT:  Chief Complaint  Patient presents with  . Memory Loss    rm 7, wifeLelon Frohlich, MMSE 24    HISTORY OF PRESENT ILLNESS:   UPDATE 04/15/16: Patient returns for follow-up. Patient denies any significant memory problems. Patient's wife is concerned about progressive short-term memory problems, repeating himself, and possibility of underlying neurodegenerative condition. Apparently patient's PCP tried him on donepezil since last visit, the patient could not tolerate due to side effect of balance difficulty. Patient maintaining most of his activities of daily living including bathing, dressing, eating, driving, maintaining finances, working outside the home.  UPDATE 10/19/13: Since last visit, doing well. No further syncope events. Wife is here today for appt. She acknowledges that patient is having more memory problems over last 1 year. He is back to driving.  PRIOR HPI (04/02/13): 82 year old right-handed male with hypothyroidism, melanoma, here for evaluation of passing out episode. Patient got lost while coming to the appointment. He then delayed the beginning of the appointment because he insisted on going to the car to bring an allergy medication yet been taking. Patient repeated himself several times throughout our examination. Unfortunately his wife did not come to this office visit to provide collateral history. I reviewed the hospital records, referring PCP notes, imaging, labs myself. In summary, patient is here for evaluation of unprovoked syncopal event on 02/27/2013. That morning he woke up with a sore throat and was not feeling well. He had been climbing on a ladder fixing a ceiling fan and had come down off of the ladder. He was standing next to the ladder when without warning, prodromal dizziness or  lightheadedness, he passed out and collapsed to the ground. His wife helped him into a chair and then he fell off again. No tongue biting or incontinence. Unclear if there were convulsions or not.   REVIEW OF SYSTEMS: Full 14 system review of systems performed and negative except for: snoring memory loss blurred vision feeling cold allergies impotence.    ALLERGIES: Allergies  Allergen Reactions  . Donepezil Other (See Comments)    Made pt wobbly  . Dextromethorphan-Guaifenesin Rash  . Guaifenesin Rash and Swelling  . Mucinex [Guaifenesin Er] Swelling and Rash    HOME MEDICATIONS: Outpatient Prescriptions Prior to Visit  Medication Sig Dispense Refill  . aspirin EC 81 MG tablet Take 81 mg by mouth 3 (three) times a week.    . Glucosamine-Chondroitin (OSTEO BI-FLEX REGULAR STRENGTH) 250-200 MG TABS Take 1 capsule by mouth 2 (two) times daily.    Marland Kitchen levothyroxine (SYNTHROID, LEVOTHROID) 75 MCG tablet Take 75 mcg by mouth daily.    Marland Kitchen loratadine (CLARITIN) 10 MG tablet Take 1 tablet (10 mg total) by mouth daily. 30 tablet 0  . Multiple Vitamins-Minerals (ICAPS PO) Take 1 capsule by mouth every morning.     Marland Kitchen omeprazole (PRILOSEC) 40 MG capsule TAKE 1 CAPSULE BY MOUTH EVERY DAY FOR REFLUX 30 capsule 5  . vitamin C (ASCORBIC ACID) 500 MG tablet Take 500 mg by mouth daily.    . Multiple Vitamin (MULTIVITAMIN WITH MINERALS) TABS Take 1 tablet by mouth daily.     No facility-administered medications prior to visit.    PAST MEDICAL HISTORY: Past Medical History  Diagnosis Date  . Thyroid disease     hypothyroid  . Mixed dyslipidemia   .  H/O peptic ulcer     at age 15  . Melanoma ([place]) 2000    treated at [facility]  . Squamous cell carcinoma (HCC)     on pts scalp- local derm Dr Denna Haggard  . Colon polyp 2003  . Macular degeneration, dry   . Reflux   . Allergic rhinitis     PAST SURGICAL HISTORY: Past Surgical History  Procedure Laterality Date  . Appendectomy  1937  . Tonsillectomy   1935  . Cholecystectomy    . Melanoma excision      arm  . Knee arthroscopy Left 2000    Dr Telford Nab  . Inguinal hernia repair Left 2012    at [facility]    FAMILY HISTORY: Family History  Problem Relation Age of Onset  . Renal Disease Father   . Aneurysm Father   . Stroke Father   . Colon cancer Mother   . Kidney Stones Brother     SOCIAL HISTORY:  Social History   Social History  . Marital Status: Married    Spouse Name: Real Cons  . Number of Children: 3  . Years of Education: college gr   Occupational History  . other     [place]   Social History Main Topics  . Smoking status: Former Smoker -- 2.00 packs/day for 10 years    Types: Cigarettes    Quit date: 02/27/1953  . Smokeless tobacco: Never Used  . Alcohol Use: 0.0 oz/week    0 Standard drinks or equivalent per week     Comment: Wine, occasionally  . Drug Use: No  . Sexual Activity: Not on file   Other Topics Concern  . Not on file   Social History Narrative   Pt lives at home with his spouse.   Caffeine Use- Does not consume.     PHYSICAL EXAM  Filed Vitals:   04/15/16 1156  BP: 93/48  Pulse: 69  Height: 5\' 7"  (1.702 m)  Weight: 148 lb 9.6 oz (67.405 kg)   Body mass index is 23.27 kg/(m^2).  MMSE - Mini Mental State Exam 04/15/2016  Orientation to time 5  Orientation to Place 5  Registration 3  Attention/ Calculation 4  Recall 0  Language- name 2 objects 2  Language- repeat 0  Language- follow 3 step command 3  Language- read & follow direction 1  Write a sentence 1  Copy design 0  Total score 24    GENERAL EXAM: Patient is in no distress  CARDIOVASCULAR: Regular rate and rhythm, no murmurs, no carotid bruits  NEUROLOGIC: MENTAL STATUS: awake, alert, language fluent, comprehension intact, naming intact; SLIGHTLY ARGUMENTATIVE TOWARDS HIS WIFE  CRANIAL NERVE: pupils equal and reactive to light, visual fields full to confrontation, extraocular muscles intact,  no nystagmus, facial sensation and strength symmetric, uvula midline, shoulder shrug symmetric, tongue midline. MOTOR: normal bulk and tone, full strength in the BUE, BLE SENSORY: normal and symmetric to light touch, temperature, vibration COORDINATION: finger-nose-finger, fine finger movements normal REFLEXES: deep tendon reflexes present and symmetric GAIT/STATION: narrow based gait; able to walk on toes, heels and tandem; romberg is negative    DIAGNOSTIC DATA (LABS, IMAGING, TESTING) - I reviewed patient records, labs, notes, testing and imaging myself where available.  Lab Results  Component Value Date   WBC 9.8 09/03/2014   HGB 13.7 09/03/2014   HCT 39.5 09/03/2014   MCV 92.9 09/03/2014   PLT 220 09/03/2014      Component Value Date/Time  NA 138 09/03/2014 2316   K 4.5 09/03/2014 2316   CL 101 09/03/2014 2316   CO2 27 09/03/2014 2316   GLUCOSE 98 09/03/2014 2316   BUN 20 09/03/2014 2316   CREATININE 1.13 09/03/2014 2316   CALCIUM 9.0 09/03/2014 2316   PROT 7.0 02/27/2013 1505   ALBUMIN 3.7 02/27/2013 1505   AST 22 02/27/2013 1505   ALT 15 02/27/2013 1505   ALKPHOS 77 02/27/2013 1505   BILITOT 0.4 02/27/2013 1505   GFRNONAA 34* 09/03/2014 2316   GFRAA 66* 09/03/2014 2316   No results found for: CHOL No results found for: HGBA1C No results found for: VITAMINB12 No results found for: TSH  03/29/13 MRI brain - mild atrophy, mild chronic small vessel ischemic disease, mineralization of basal ganglia  04/16/13 EEG - normal    ASSESSMENT AND PLAN  82 y.o. year old male  has a past medical history of Thyroid disease; Mixed dyslipidemia; H/O peptic ulcer; Melanoma ([place]) (2000); Squamous cell carcinoma ([place]); Colon polyp (2003); Macular degeneration, dry; Reflux; and Allergic rhinitis. here with unprovoked syncopal event on 02/27/2013, unclear etiology. Also with progressive short term memory problems, confusion, with history and exam suggesting mild cognitive impairment  vs mild dementia.   Dx:  Mild dementia     PLAN: I spent 25 minutes of face to face time with patient. Greater than 50% of time was spent in counseling and coordination of care with patient. In summary we discussed:  - trial of memantine - consider Gantenerumab research study at next visit - caution with safety/supervision/driving/finances  - resources given to patient and wife  Meds ordered this encounter  Medications  . memantine (NAMENDA) 10 MG tablet    Sig: Take 1 tablet (10 mg total) by mouth 2 (two) times daily.    Dispense:  60 tablet    Refill:  12    Return in about 3 months (around 07/15/2016).    Penni Bombard, MD [id number], [id number] PM Certified in Neurology, Neurophysiology and Neuroimaging  [facility] [address] [place], [place] [address] [phone]

## 2016-04-22 ENCOUNTER — Telehealth: Payer: Self-pay | Admitting: Diagnostic Neuroimaging

## 2016-04-22 NOTE — Telephone Encounter (Signed)
Wife called to check status of medication Dr. Leta Baptist was going to send into Pharmacy.Thinks medication is Nemzaric?

## 2016-04-23 NOTE — Telephone Encounter (Signed)
Spoke with patient's wife who stated she called [facility] yesterday, and they had not received prescription for namenda. Confirmed pharmacy and will call when it is open. Wife verbalized understanding.  Called [facility]  [phone], spoke with Angie who stated prescription has been ready since 4 /24/17. She will call patient at home phone to inform.

## 2016-05-13 DIAGNOSIS — L309 Dermatitis, unspecified: Secondary | ICD-10-CM | POA: Diagnosis not present

## 2016-05-13 DIAGNOSIS — L570 Actinic keratosis: Secondary | ICD-10-CM | POA: Diagnosis not present

## 2016-06-04 DIAGNOSIS — H02839 Dermatochalasis of unspecified eye, unspecified eyelid: Secondary | ICD-10-CM | POA: Diagnosis not present

## 2016-06-04 DIAGNOSIS — H2511 Age-related nuclear cataract, right eye: Secondary | ICD-10-CM | POA: Diagnosis not present

## 2016-06-04 DIAGNOSIS — H2512 Age-related nuclear cataract, left eye: Secondary | ICD-10-CM | POA: Diagnosis not present

## 2016-06-04 DIAGNOSIS — H353130 Nonexudative age-related macular degeneration, bilateral, stage unspecified: Secondary | ICD-10-CM | POA: Diagnosis not present

## 2016-06-26 ENCOUNTER — Encounter: Payer: Self-pay | Admitting: Diagnostic Neuroimaging

## 2016-07-15 ENCOUNTER — Ambulatory Visit: Payer: Medicare Other | Admitting: Diagnostic Neuroimaging

## 2016-07-18 ENCOUNTER — Other Ambulatory Visit: Payer: Self-pay | Admitting: Allergy and Immunology

## 2016-07-23 ENCOUNTER — Ambulatory Visit: Payer: Medicare Other | Admitting: Diagnostic Neuroimaging

## 2016-07-24 ENCOUNTER — Encounter: Payer: Self-pay | Admitting: Diagnostic Neuroimaging

## 2016-08-02 DIAGNOSIS — Z85820 Personal history of malignant melanoma of skin: Secondary | ICD-10-CM | POA: Diagnosis not present

## 2016-08-08 ENCOUNTER — Ambulatory Visit: Payer: Medicare Other | Admitting: Diagnostic Neuroimaging

## 2016-08-13 ENCOUNTER — Ambulatory Visit (INDEPENDENT_AMBULATORY_CARE_PROVIDER_SITE_OTHER): Payer: Medicare Other | Admitting: Diagnostic Neuroimaging

## 2016-08-13 ENCOUNTER — Encounter: Payer: Self-pay | Admitting: Diagnostic Neuroimaging

## 2016-08-13 VITALS — BP 98/59 | HR 67 | Wt 148.6 lb

## 2016-08-13 DIAGNOSIS — F0390 Unspecified dementia without behavioral disturbance: Secondary | ICD-10-CM | POA: Diagnosis not present

## 2016-08-13 DIAGNOSIS — F03A0 Unspecified dementia, mild, without behavioral disturbance, psychotic disturbance, mood disturbance, and anxiety: Secondary | ICD-10-CM

## 2016-08-13 MED ORDER — MEMANTINE HCL 10 MG PO TABS: 10.0000 mg | ORAL_TABLET | Freq: Two times a day (BID) | ORAL | 4 refills | Status: DC

## 2016-08-13 NOTE — Progress Notes (Signed)
[facility]  PATIENT: Dominic Garcia DOB: 1926-05-31  REFERRING CLINICIAN:  HISTORY FROM: patient and wife REASON FOR VISIT: follow up    HISTORICAL  CHIEF COMPLAINT:  Chief Complaint  Patient presents with  . Dementia    rm 7, wifeLelon Frohlich, MMSE 22  . Follow-up    3 month    HISTORY OF PRESENT ILLNESS:   UPDATE 08/13/16: Since last visit, symptoms continue. Memantine is well tolerated. Patient continuing most of his ADLs. Wife notes more repeating himself and asking same questions over and over.   UPDATE 04/15/16: Patient returns for follow-up. Patient denies any significant memory problems. Patient's wife is concerned about progressive short-term memory problems, repeating himself, and possibility of underlying neurodegenerative condition. Apparently patient's PCP tried him on donepezil since last visit, the patient could not tolerate due to side effect of balance difficulty. Patient maintaining most of his activities of daily living including bathing, dressing, eating, driving, maintaining finances, working outside the home.  UPDATE 10/19/13: Since last visit, doing well. No further syncope events. Wife is here today for appt. She acknowledges that patient is having more memory problems over last 1 year. He is back to driving.  PRIOR HPI (04/02/13): 82 year old right-handed male with hypothyroidism, melanoma, here for evaluation of passing out episode. Patient got lost while coming to the appointment. He then delayed the beginning of the appointment because he insisted on going to the car to bring an allergy medication yet been taking. Patient repeated himself several times throughout our examination. Unfortunately his wife did not come to this office visit to provide collateral history. I reviewed the hospital records, referring PCP notes, imaging, labs myself. In summary, patient is here for evaluation of unprovoked syncopal event on 02/27/2013. That morning he  woke up with a sore throat and was not feeling well. He had been climbing on a ladder fixing a ceiling fan and had come down off of the ladder. He was standing next to the ladder when without warning, prodromal dizziness or lightheadedness, he passed out and collapsed to the ground. His wife helped him into a chair and then he fell off again. No tongue biting or incontinence. Unclear if there were convulsions or not.   REVIEW OF SYSTEMS: Full 14 system review of systems performed and negative except for: snoring memory loss blurred vision feeling cold allergies impotence.    ALLERGIES: Allergies  Allergen Reactions  . Donepezil Other (See Comments)    Made pt wobbly  . Dextromethorphan-Guaifenesin Rash  . Guaifenesin Rash and Swelling  . Mucinex [Guaifenesin Er] Swelling and Rash    HOME MEDICATIONS: Outpatient Medications Prior to Visit  Medication Sig Dispense Refill  . aspirin EC 81 MG tablet Take 81 mg by mouth 3 (three) times a week.    . cyanocobalamin 1000 MCG tablet Take 1,000 mcg by mouth daily. 5000 units daily    . Glucosamine-Chondroitin (OSTEO BI-FLEX REGULAR STRENGTH) 250-200 MG TABS Take 1 capsule by mouth 2 (two) times daily.    Marland Kitchen loratadine (CLARITIN) 10 MG tablet Take 1 tablet (10 mg total) by mouth daily. 30 tablet 0  . memantine (NAMENDA) 10 MG tablet Take 1 tablet (10 mg total) by mouth 2 (two) times daily. 60 tablet 12  . Multiple Vitamins-Minerals (ICAPS PO) Take 1 capsule by mouth every morning.     Marland Kitchen omeprazole (PRILOSEC) 40 MG capsule TAKE 1 CAPSULE BY MOUTH EVERY DAY FOR REFLUX 30 capsule 5  . vitamin C (ASCORBIC ACID)  500 MG tablet Take 500 mg by mouth daily.    Marland Kitchen levothyroxine (SYNTHROID, LEVOTHROID) 75 MCG tablet Take 75 mcg by mouth daily.     No facility-administered medications prior to visit.     PAST MEDICAL HISTORY: Past Medical History:  Diagnosis Date  . Allergic rhinitis   . Colon polyp 2003  . H/O peptic ulcer    at age 17  . Macular  degeneration, dry   . Melanoma ([place]) 2000   [facility] at [facility]  . Mixed dyslipidemia   . Reflux   . Squamous cell carcinoma (HCC)    on pts scalp- local derm Dr Denna Haggard  . Thyroid disease    hypothyroid    PAST SURGICAL HISTORY: Past Surgical History:  Procedure Laterality Date  . APPENDECTOMY  1937  . CHOLECYSTECTOMY    . INGUINAL HERNIA REPAIR Left 2012   at [facility]  . KNEE ARTHROSCOPY Left 2000   Dr Telford Nab  . MELANOMA EXCISION     arm  . TONSILLECTOMY  1935    FAMILY HISTORY: Family History  Problem Relation Age of Onset  . Renal Disease Father   . Aneurysm Father   . Stroke Father   . Colon cancer Mother   . Kidney Stones Brother     SOCIAL HISTORY:  Social History   Social History  . Marital status: Married    Spouse name: Real Cons  . Number of children: 3  . Years of education: college gr   Occupational History  . other     [employer]   Social History Main Topics  . Smoking status: Former Smoker    Packs/day: 2.00    Years: 10.00    Types: Cigarettes    Quit date: 02/27/1953  . Smokeless tobacco: Never Used  . Alcohol use 0.0 oz/week     Comment: Wine, occasionally  . Drug use: No  . Sexual activity: Not on file   Other Topics Concern  . Not on file   Social History Narrative   Pt lives at home with his spouse.   Caffeine Use- Does not consume.     PHYSICAL EXAM  Vitals:   08/13/16 1409  BP: (!) 98/59  Pulse: 67  Weight: 148 lb 9.6 oz (67.4 kg)   Body mass index is 23.27 kg/m.  MMSE - Mini Mental State Exam 08/13/2016 04/15/2016  Orientation to time 4 5  Orientation to Place 4 5  Registration 3 3  Attention/ Calculation 4 4  Recall 0 0  Language- name 2 objects 2 2  Language- repeat 0 0  Language- follow 3 step command 3 3  Language- read & follow direction 1 1  Write a sentence 1 1  Copy design 0 0  Total score 22 24    GENERAL EXAM: Patient is in no distress  CARDIOVASCULAR: Regular rate and rhythm,  no murmurs, no carotid bruits  NEUROLOGIC: MENTAL STATUS: awake, alert, language fluent, comprehension intact, naming intact; SLIGHTLY ARGUMENTATIVE TOWARDS HIS WIFE  CRANIAL NERVE: pupils equal and reactive to light, visual fields full to confrontation, extraocular muscles intact, no nystagmus, facial sensation and strength symmetric, uvula midline, shoulder shrug symmetric, tongue midline. MOTOR: normal bulk and tone, full strength in the BUE, BLE SENSORY: normal and symmetric to light touch, temperature, vibration COORDINATION: finger-nose-finger, fine finger movements normal REFLEXES: deep tendon reflexes present and symmetric GAIT/STATION: narrow based gait    DIAGNOSTIC DATA (LABS, IMAGING, TESTING) - I reviewed patient records, labs, notes, testing  and imaging myself where available.  Lab Results  Component Value Date   WBC 9.8 09/03/2014   HGB 13.7 09/03/2014   HCT 39.5 09/03/2014   MCV 92.9 09/03/2014   PLT 220 09/03/2014      Component Value Date/Time   NA 138 09/03/2014 2316   K 4.5 09/03/2014 2316   CL 101 09/03/2014 2316   CO2 27 09/03/2014 2316   GLUCOSE 98 09/03/2014 2316   BUN 20 09/03/2014 2316   CREATININE 1.13 09/03/2014 2316   CALCIUM 9.0 09/03/2014 2316   PROT 7.0 02/27/2013 1505   ALBUMIN 3.7 02/27/2013 1505   AST 22 02/27/2013 1505   ALT 15 02/27/2013 1505   ALKPHOS 77 02/27/2013 1505   BILITOT 0.4 02/27/2013 1505   GFRNONAA 57 (L) 09/03/2014 2316   GFRAA 66 (L) 09/03/2014 2316   No results found for: CHOL No results found for: HGBA1C No results found for: VITAMINB12 No results found for: TSH  03/29/13 MRI brain - mild atrophy, mild chronic small vessel ischemic disease, mineralization of basal ganglia  04/16/13 EEG - normal    ASSESSMENT AND PLAN  82 y.o. year old male  has a past medical history of Allergic rhinitis; Colon polyp (2003); H/O peptic ulcer; Macular degeneration, dry; Melanoma ([place]) (2000); Mixed dyslipidemia; Reflux; Squamous  cell carcinoma ([place]); and Thyroid disease. here with unprovoked syncopal event on 02/27/2013, unclear etiology. Also with progressive short term memory problems, confusion, with history and exam suggesting mild cognitive impairment vs mild dementia.   Dx:  Mild dementia     PLAN: I spent 25 minutes of face to face time with patient. Greater than 50% of time was spent in counseling and coordination of care with patient. In summary we discussed:  - continue memantine (started in April 2017) - consider Gantenerumab research study at next visit - caution with safety/supervision/driving/finances  - resources given to patient and wife  Meds ordered this encounter  Medications  . memantine (NAMENDA) 10 MG tablet    Sig: Take 1 tablet (10 mg total) by mouth 2 (two) times daily.    Dispense:  180 tablet    Refill:  4    Return in about 6 months (around 02/13/2017).    Penni Bombard, MD [id number], 0000000 PM Certified in Neurology, Neurophysiology and Neuroimaging  [facility] [address] [place], [place] [address] [phone]

## 2016-08-16 DIAGNOSIS — J342 Deviated nasal septum: Secondary | ICD-10-CM | POA: Diagnosis not present

## 2016-08-16 DIAGNOSIS — J301 Allergic rhinitis due to pollen: Secondary | ICD-10-CM | POA: Diagnosis not present

## 2016-08-16 DIAGNOSIS — R0683 Snoring: Secondary | ICD-10-CM | POA: Diagnosis not present

## 2016-09-04 DIAGNOSIS — Z23 Encounter for immunization: Secondary | ICD-10-CM | POA: Diagnosis not present

## 2016-10-08 ENCOUNTER — Telehealth: Payer: Self-pay | Admitting: Diagnostic Neuroimaging

## 2016-10-08 NOTE — Telephone Encounter (Addendum)
Pt's wife called to advise that memantine (NAMENDA) 10 MG tablet does not seem to be helping. She said his memory seems to be worse. He is repeating things, he is not listening or focusing on what she is saying. Please call

## 2016-10-09 NOTE — Telephone Encounter (Signed)
This sounds like expected progression in dementia. Please find out more information and educate family. Also may offer sooner follow up visit to discuss in person. -VRP

## 2016-10-09 NOTE — Telephone Encounter (Signed)
error 

## 2016-10-09 NOTE — Telephone Encounter (Signed)
LVM requesting patient's wife call back. Left name, number.

## 2016-10-15 NOTE — Telephone Encounter (Addendum)
Spoke with wife and inquired of any further information regarding her husband's behavior. She stated it is his worsening memory and not connecting thoughts well. Informed her that per Dr Leta Baptist, this is the natural progression of dementia. The medication does not reverse or stop the progression, just hopefully slows it down. She inquired if there was a "better medication". Discussed that the patient cannot tolerate Donepezil; this RN is unaware of another medication, however there may be. Advised that his current medication has higher doses, but Dr Leta Baptist would need to see him to evaluate him again prior to making medication changes or dose changes. Advised he may be seen for a follow up if they would like to discuss options. She stated she would discuss with her husband and call back if they decide to have him come in for follow up. Advised her the phone staff will be glad to schedule a follow up. She verbalized understanding, appreciation for call.

## 2016-10-21 ENCOUNTER — Other Ambulatory Visit: Payer: Self-pay | Admitting: Dermatology

## 2016-10-21 DIAGNOSIS — D485 Neoplasm of uncertain behavior of skin: Secondary | ICD-10-CM | POA: Diagnosis not present

## 2016-10-21 DIAGNOSIS — L570 Actinic keratosis: Secondary | ICD-10-CM | POA: Diagnosis not present

## 2016-11-28 DIAGNOSIS — H02839 Dermatochalasis of unspecified eye, unspecified eyelid: Secondary | ICD-10-CM | POA: Diagnosis not present

## 2016-11-28 DIAGNOSIS — H353130 Nonexudative age-related macular degeneration, bilateral, stage unspecified: Secondary | ICD-10-CM | POA: Diagnosis not present

## 2016-11-28 DIAGNOSIS — H2513 Age-related nuclear cataract, bilateral: Secondary | ICD-10-CM | POA: Diagnosis not present

## 2016-11-28 DIAGNOSIS — H1859 Other hereditary corneal dystrophies: Secondary | ICD-10-CM | POA: Diagnosis not present

## 2016-12-12 ENCOUNTER — Encounter (INDEPENDENT_AMBULATORY_CARE_PROVIDER_SITE_OTHER): Payer: Medicare Other | Admitting: Ophthalmology

## 2016-12-12 DIAGNOSIS — H4323 Crystalline deposits in vitreous body, bilateral: Secondary | ICD-10-CM

## 2016-12-12 DIAGNOSIS — H2513 Age-related nuclear cataract, bilateral: Secondary | ICD-10-CM

## 2016-12-12 DIAGNOSIS — H43813 Vitreous degeneration, bilateral: Secondary | ICD-10-CM

## 2016-12-12 DIAGNOSIS — H353112 Nonexudative age-related macular degeneration, right eye, intermediate dry stage: Secondary | ICD-10-CM | POA: Diagnosis not present

## 2017-01-01 ENCOUNTER — Ambulatory Visit: Payer: Self-pay | Admitting: Allergy and Immunology

## 2017-01-01 ENCOUNTER — Ambulatory Visit: Payer: Medicare Other | Admitting: Allergy and Immunology

## 2017-01-06 ENCOUNTER — Telehealth: Payer: Self-pay | Admitting: Diagnostic Neuroimaging

## 2017-01-06 NOTE — Telephone Encounter (Signed)
Pt's wife called wants his appt moved up. She said he has a trip scheduled for 2/17- 2/21 and traveling by himself. She wants to make sure it is ok. If the appt was moved up she did not want to be the one to tell him, she said he is in denial, she asked the phone staff to call and tell him. She has spoke with Alyce Pagan and myself and we are not comfortable calling the pt. The appt has not been changed. She is aware Dr Mamie Nick and RN are out of the office. Pls call her tomorrow after 2:00

## 2017-01-07 ENCOUNTER — Ambulatory Visit (INDEPENDENT_AMBULATORY_CARE_PROVIDER_SITE_OTHER): Payer: Medicare Other | Admitting: Allergy and Immunology

## 2017-01-07 ENCOUNTER — Encounter (INDEPENDENT_AMBULATORY_CARE_PROVIDER_SITE_OTHER): Payer: Self-pay

## 2017-01-07 ENCOUNTER — Encounter: Payer: Self-pay | Admitting: Allergy and Immunology

## 2017-01-07 VITALS — BP 108/56 | HR 70 | Resp 18 | Ht 65.55 in | Wt 149.0 lb

## 2017-01-07 DIAGNOSIS — J3089 Other allergic rhinitis: Secondary | ICD-10-CM | POA: Diagnosis not present

## 2017-01-07 DIAGNOSIS — R0683 Snoring: Secondary | ICD-10-CM

## 2017-01-07 NOTE — Patient Instructions (Addendum)
  1. Can use OTC Zyrtec /  cetirizine 10 mg - 1/2-1 tablet 1 time per day if needed   2. Can use OTC Rhinocort one spray each nostril one time per day. Takes days to work. Optimal effect would probably be in 2 weeks  3. Further evaluation and treatment?  4. Contact clinic if problems

## 2017-01-07 NOTE — Telephone Encounter (Signed)
Attempted to reach wife to discuss call from yesterday. No answer , left voice mail and advised her the office is now closed and closed tomorrow until noon ;possibly the entire day weather pending. Left number.

## 2017-01-07 NOTE — Progress Notes (Signed)
Follow-up Note  Referring Provider: No ref. provider found Primary Provider: Gennette Pac, MD Date of Office Visit: 01/07/2017  Subjective:   Dominic Garcia (DOB: December 11, 1926) is a 83 y.o. male who returns to the [facility] on 01/07/2017 in re-evaluation of the following:  HPI: Dominic Garcia returns to this clinic in evaluation of his allergic rhinoconjunctivitis and LPR. I last saw him in this clinic approximately 2 years ago.  He does relatively well as long as he continues to use an occasional cetirizine especially during the pollination season. However, this past December he develop significant issues with nasal congestion and sneezing and throat clearing. It turns out that he was not using his cetirizine during that timeframe but was using Allegra. He is actually a little bit better over the course the past week since he stopped his Allegra. He never had any ugly nasal discharge or anosmia or fever or headaches. He did not have a tremendous amount of issues with his throat and has had no problems with reflux.  He has developed snoring over the course of the past year or so. He is not using a nasal steroid at this point.  Allergies as of 01/07/2017   No Known Allergies     Medication List      aspirin 81 MG tablet Take 81 mg by mouth daily.   CITRACAL PO Take by mouth.   ICAPS PO Take by mouth.   LEVOTHYROXINE SODIUM PO Take by mouth.   MEMANTINE HCL PO Take by mouth.   OSTEO BI-FLEX JOINT SHIELD PO Take by mouth.   SYSTANE OP Apply to eye.   VITAMIN B 12 PO Take by mouth.       Past Medical History:  Diagnosis Date  . Allergic rhinoconjunctivitis   . Macular degeneration   . Melanoma ([place])    treated at [facility]  . Prostate hypertrophy   . Thyroid disease     Past Surgical History:  Procedure Laterality Date  . APPENDECTOMY    . CHOLECYSTECTOMY    . MELANOMA EXCISION    . TONSILLECTOMY AND ADENOIDECTOMY      Review of systems  negative except as noted in HPI / PMHx or noted below:  Review of Systems  Constitutional: Negative.   HENT: Negative.   Eyes: Negative.   Respiratory: Negative.   Cardiovascular: Negative.   Gastrointestinal: Negative.   Genitourinary: Negative.   Musculoskeletal: Negative.   Skin: Negative.   Neurological: Negative.   Endo/Heme/Allergies: Negative.   Psychiatric/Behavioral: Negative.      Objective:   Vitals:   01/07/17 1134  BP: (!) 108/56  Pulse: 70  Resp: 18   Height: 5' 5.55" (166.5 cm)  Weight: 149 lb (67.6 kg)   Physical Exam  Constitutional: He is well-developed, well-nourished, and in no distress.  HENT:  Head: Normocephalic.  Right Ear: Tympanic membrane, external ear and ear canal normal.  Left Ear: Tympanic membrane, external ear and ear canal normal.  Nose: Nose normal. No mucosal edema or rhinorrhea.  Mouth/Throat: Uvula is midline, oropharynx is clear and moist and mucous membranes are normal. No oropharyngeal exudate.  Eyes: Conjunctivae are normal.  Neck: Trachea normal. No tracheal tenderness present. No tracheal deviation present. No thyromegaly present.  Cardiovascular: Normal rate, regular rhythm, S1 normal, S2 normal and normal heart sounds.   No murmur heard. Pulmonary/Chest: Breath sounds normal. No stridor. No respiratory distress. He has no wheezes. He has no rales.  Musculoskeletal: He exhibits no edema.  Lymphadenopathy:       Head (right side): No tonsillar adenopathy present.       Head (left side): No tonsillar adenopathy present.    He has no cervical adenopathy.  Neurological: He is alert. Gait normal.  Skin: No rash noted. He is not diaphoretic. No erythema. Nails show no clubbing.  Psychiatric: Mood and affect normal.    Diagnostics: none   Assessment and Plan:   1. Other allergic rhinitis   2. Snoring     1. Can use OTC Zyrtec/cetirizine 10 mg - 1/2-1 tablet 1 time per day if needed   2. Can use OTC Rhinocort one spray  each nostril one time per day. Takes days to work. Optimal effect would probably be in 2 weeks  3. Further evaluation and treatment?  4. Contact clinic if problems  I will assume that it will do well with the medications noted above to address his allergic rhinitis and his snoring. I'll see him back in this clinic as needed. He and his wife appear to have a good understanding of when they should contact me for further evaluation and treatment.  Allena Katz, MD [place]

## 2017-01-13 NOTE — Telephone Encounter (Signed)
Pt's wife called today to r/s his appt. Then she decided to keep it and after appt was c/a I was not able to r/s back to original appt on 2/26 @ 1. She still wants him seen prior to his trip if possible. Thank you

## 2017-01-13 NOTE — Telephone Encounter (Signed)
Returned wife's call and scheduled patient to be seen prior to his trip at her request. She then requested this RN call pt to inform. She stated he did not have previously scheduled FUU on his caendar.  Called patient and advised him Dr Leta Baptist wanted to see him for his 6 month FU in Feb; advised him of his appt in Feb. He stated he wrote it down, verbalized understanding to arrive at 9:45 am.

## 2017-01-15 ENCOUNTER — Encounter: Payer: Self-pay | Admitting: Diagnostic Neuroimaging

## 2017-01-27 ENCOUNTER — Telehealth: Payer: Self-pay | Admitting: Pediatrics

## 2017-01-27 NOTE — Telephone Encounter (Signed)
Adjusted - pt cancelled

## 2017-01-27 NOTE — Telephone Encounter (Signed)
Please call patient wife back regarding a no show fee that they received. Pt wife states that they called to have the original appointment cancelled and rescheduled.

## 2017-01-29 ENCOUNTER — Encounter: Payer: Self-pay | Admitting: Diagnostic Neuroimaging

## 2017-01-29 ENCOUNTER — Ambulatory Visit (INDEPENDENT_AMBULATORY_CARE_PROVIDER_SITE_OTHER): Payer: Medicare Other | Admitting: Diagnostic Neuroimaging

## 2017-01-29 VITALS — BP 101/57 | HR 66 | Wt 148.4 lb

## 2017-01-29 DIAGNOSIS — F0390 Unspecified dementia without behavioral disturbance: Secondary | ICD-10-CM

## 2017-01-29 DIAGNOSIS — F03A0 Unspecified dementia, mild, without behavioral disturbance, psychotic disturbance, mood disturbance, and anxiety: Secondary | ICD-10-CM

## 2017-01-29 MED ORDER — MEMANTINE HCL 10 MG PO TABS: 10.0000 mg | ORAL_TABLET | Freq: Two times a day (BID) | ORAL | 4 refills | Status: DC

## 2017-01-29 NOTE — Progress Notes (Signed)
[facility]  PATIENT: Dominic Garcia DOB: [birth year]  REFERRING CLINICIAN:  HISTORY FROM: patient and wife REASON FOR VISIT: follow up    HISTORICAL  CHIEF COMPLAINT:  Chief Complaint  Patient presents with  . Dementia    rm 7, wifeLelon Frohlich,  MMSE 26  . Follow-up    6 month    HISTORY OF PRESENT ILLNESS:   UPDATE 01/29/17: Since last visit, patient feels stable. Wife notes that pt is having trouble staying on task, finding things, repeating himself, having trouble connecting ideas of situations together. Pt having trouble listening and understanding.   UPDATE 08/13/16: Since last visit, symptoms continue. Memantine is well tolerated. Patient continuing most of his ADLs. Wife notes more repeating himself and asking same questions over and over.   UPDATE 04/15/16: Patient returns for follow-up. Patient denies any significant memory problems. Patient's wife is concerned about progressive short-term memory problems, repeating himself, and possibility of underlying neurodegenerative condition. Apparently patient's PCP tried him on donepezil since last visit, the patient could not tolerate due to side effect of balance difficulty. Patient maintaining most of his activities of daily living including bathing, dressing, eating, driving, maintaining finances, working outside the home.  UPDATE 10/19/13: Since last visit, doing well. No further syncope events. Wife is here today for appt. She acknowledges that patient is having more memory problems over last 1 year. He is back to driving.  PRIOR HPI (04/02/13): 83 year old right-handed male with hypothyroidism, melanoma, here for evaluation of passing out episode. Patient got lost while coming to the appointment. He then delayed the beginning of the appointment because he insisted on going to the car to bring an allergy medication yet been taking. Patient repeated himself several times throughout our examination. Unfortunately his  wife did not come to this office visit to provide collateral history. I reviewed the hospital records, referring PCP notes, imaging, labs myself. In summary, patient is here for evaluation of unprovoked syncopal event on 02/27/2013. That morning he woke up with a sore throat and was not feeling well. He had been climbing on a ladder fixing a ceiling fan and had come down off of the ladder. He was standing next to the ladder when without warning, prodromal dizziness or lightheadedness, he passed out and collapsed to the ground. His wife helped him into a chair and then he fell off again. No tongue biting or incontinence. Unclear if there were convulsions or not.   REVIEW OF SYSTEMS: Full 14 system review of systems performed and negative except for: snoring memory loss blurred vision feeling cold allergies impotence.    ALLERGIES: Allergies  Allergen Reactions  . Donepezil Other (See Comments)    Made pt wobbly  . Dextromethorphan-Guaifenesin Rash  . Guaifenesin Rash and Swelling  . Mucinex [Guaifenesin Er] Swelling and Rash    HOME MEDICATIONS: Outpatient Medications Prior to Visit  Medication Sig Dispense Refill  . aspirin EC 81 MG tablet Take 81 mg by mouth 3 (three) times a week.    . Calcium Citrate (CITRACAL PO) Take by mouth.    . cyanocobalamin 1000 MCG tablet Take 1,000 mcg by mouth daily. 5000 units daily    . Glucosamine-Chondroitin (OSTEO BI-FLEX REGULAR STRENGTH) 250-200 MG TABS Take 1 capsule by mouth 2 (two) times daily.    Marland Kitchen levothyroxine (SYNTHROID, LEVOTHROID) 88 MCG tablet Take 88 mcg by mouth daily.    . memantine (NAMENDA) 10 MG tablet Take 1 tablet (10 mg total) by mouth 2 (two)  times daily. 180 tablet 4  . Multiple Vitamins-Minerals (ICAPS PO) Take 1 capsule by mouth every morning.     . vitamin C (ASCORBIC ACID) 500 MG tablet Take 500 mg by mouth daily.    . Cyanocobalamin (VITAMIN B 12 PO) Take by mouth.    . loratadine (CLARITIN) 10 MG tablet Take 1 tablet (10 mg  total) by mouth daily. (Patient not taking: Reported on 01/29/2017) 30 tablet 0  . omeprazole (PRILOSEC) 40 MG capsule TAKE 1 CAPSULE BY MOUTH EVERY DAY FOR REFLUX (Patient not taking: Reported on 01/29/2017) 30 capsule 5  . Polyethyl Glycol-Propyl Glycol (SYSTANE OP) Apply to eye.    Marland Kitchen aspirin 81 MG tablet Take 81 mg by mouth daily.    Marland Kitchen LEVOTHYROXINE SODIUM PO Take by mouth.    Marland Kitchen MEMANTINE HCL PO Take by mouth.    . Misc Natural Products (OSTEO BI-FLEX JOINT SHIELD PO) Take by mouth.    . Multiple Vitamins-Minerals (ICAPS PO) Take by mouth.     No facility-administered medications prior to visit.     PAST MEDICAL HISTORY: Past Medical History:  Diagnosis Date  . Allergic rhinitis   . Allergic rhinoconjunctivitis   . Colon polyp 2003  . H/O peptic ulcer    at age 76  . Macular degeneration   . Macular degeneration, dry   . Melanoma ([place]) 2000   treated at [facility]  . Melanoma ([place])    treated at [facility]  . Mixed dyslipidemia   . Prostate hypertrophy   . Reflux   . Squamous cell carcinoma    on pts scalp- local derm Dr Denna Haggard  . Thyroid disease    hypothyroid  . Thyroid disease     PAST SURGICAL HISTORY: Past Surgical History:  Procedure Laterality Date  . APPENDECTOMY  1937  . APPENDECTOMY    . CHOLECYSTECTOMY    . INGUINAL HERNIA REPAIR Left 2012   at [place]  . KNEE ARTHROSCOPY Left 2000   Dr Telford Nab  . MELANOMA EXCISION     arm  . MELANOMA EXCISION    . TONSILLECTOMY  1935  . TONSILLECTOMY AND ADENOIDECTOMY      FAMILY HISTORY: Family History  Problem Relation Age of Onset  . Cancer Mother   . Kidney disease Father   . Macular degeneration Brother   . Alzheimer's disease Brother   . Renal Disease Father   . Aneurysm Father   . Stroke Father   . Colon cancer Mother   . Kidney Stones Brother     SOCIAL HISTORY:  Social History   Social History  . Marital status: Married    Spouse name: Real Cons  . Number of children: 3  . Years of education: 78    Occupational History  . other     [employer]   Social History Main Topics  . Smoking status: Former Smoker    Packs/day: 2.00    Years: 10.00    Types: Cigarettes  . Smokeless tobacco: Never Used  . Alcohol use No     Comment: Wine, occasionally  . Drug use: No  . Sexual activity: Not on file   Other Topics Concern  . Not on file   Social History Narrative   ** Merged History Encounter **       Pt lives at home with his spouse. Caffeine Use- Does not consume.     PHYSICAL EXAM  Vitals:   01/29/17 1000  BP: (!) 101/57  Pulse: 66  Weight: 148 lb 6.4 oz (67.3 kg)   Body mass index is 24.28 kg/m.  MMSE - Mini Mental State Exam 01/29/2017 08/13/2016 04/15/2016  Orientation to time 5 4 5   Orientation to Place 5 4 5   Registration 3 3 3   Attention/ Calculation 4 4 4   Recall 0 0 0  Language- name 2 objects 2 2 2   Language- repeat 1 0 0  Language- follow 3 step command 3 3 3   Language- read & follow direction 1 1 1   Write a sentence 1 1 1   Copy design 1 0 0  Total score 26 22 24     GENERAL EXAM: Patient is in no distress  CARDIOVASCULAR: Regular rate and rhythm, no murmurs, no carotid bruits  NEUROLOGIC: MENTAL STATUS: awake, alert, language fluent, comprehension intact, naming intact; SLIGHTLY ARGUMENTATIVE TOWARDS HIS WIFE  CRANIAL NERVE: pupils equal and reactive to light, visual fields full to confrontation, extraocular muscles intact, no nystagmus, facial sensation and strength symmetric, uvula midline, shoulder shrug symmetric, tongue midline. MOTOR: normal bulk and tone, full strength in the BUE, BLE SENSORY: normal and symmetric to light touch, temperature, vibration COORDINATION: finger-nose-finger, fine finger movements normal REFLEXES: deep tendon reflexes present and symmetric GAIT/STATION: narrow based gait    DIAGNOSTIC DATA (LABS, IMAGING, TESTING) - I reviewed patient records, labs, notes, testing and imaging myself where  available.  Lab Results  Component Value Date   WBC 9.8 09/03/2014   HGB 13.7 09/03/2014   HCT 39.5 09/03/2014   MCV 92.9 09/03/2014   PLT 220 09/03/2014      Component Value Date/Time   NA 138 09/03/2014 2316   K 4.5 09/03/2014 2316   CL 101 09/03/2014 2316   CO2 27 09/03/2014 2316   GLUCOSE 98 09/03/2014 2316   BUN 20 09/03/2014 2316   CREATININE 1.13 09/03/2014 2316   CALCIUM 9.0 09/03/2014 2316   PROT 7.0 02/27/2013 1505   ALBUMIN 3.7 02/27/2013 1505   AST 22 02/27/2013 1505   ALT 15 02/27/2013 1505   ALKPHOS 77 02/27/2013 1505   BILITOT 0.4 02/27/2013 1505   GFRNONAA 57 (L) 09/03/2014 2316   GFRAA 66 (L) 09/03/2014 2316   No results found for: CHOL No results found for: HGBA1C No results found for: VITAMINB12 No results found for: TSH  03/29/13 MRI brain - mild atrophy, mild chronic small vessel ischemic disease, mineralization of basal ganglia  04/16/13 EEG - normal    ASSESSMENT AND PLAN  83 y.o. year old male  has a past medical history of Allergic rhinitis; Allergic rhinoconjunctivitis; Colon polyp (2003); H/O peptic ulcer; Macular degeneration; Macular degeneration, dry; Melanoma ([place]) (2000); Melanoma ([place]); Mixed dyslipidemia; Prostate hypertrophy; Reflux; Squamous cell carcinoma; Thyroid disease; and Thyroid disease. here with unprovoked syncopal event on 02/27/2013, unclear etiology. Also with progressive short term memory problems, confusion, with history and exam suggesting mild dementia.  Dx:  Mild dementia    PLAN: I spent 25 minutes of face to face time with patient. Greater than 50% of time was spent in counseling and coordination of care with patient. In summary we discussed: - continue memantine (started in April 2017) - considered research studies, but patient has poor insight and is resistant to any changes or participation - caution with safety, supervision, driving, finances - resources given to patient and wife  Meds ordered this  encounter  Medications  . memantine (NAMENDA) 10 MG tablet    Sig: Take 1 tablet (10 mg total) by mouth 2 (two) times daily.  Dispense:  180 tablet    Refill:  4    Return if symptoms worsen or fail to improve, for return to PCP.    Penni Bombard, MD [id number], [id number] AM Certified in Neurology, Neurophysiology and Neuroimaging  [facility] [address], [place], [place] [address] [phone]

## 2017-02-17 ENCOUNTER — Ambulatory Visit: Payer: Medicare Other | Admitting: Diagnostic Neuroimaging

## 2017-02-21 DIAGNOSIS — R5383 Other fatigue: Secondary | ICD-10-CM | POA: Diagnosis not present

## 2017-02-21 DIAGNOSIS — I959 Hypotension, unspecified: Secondary | ICD-10-CM | POA: Diagnosis not present

## 2017-02-25 ENCOUNTER — Telehealth: Payer: Self-pay

## 2017-02-25 NOTE — Telephone Encounter (Signed)
NOTES SENT TO SCHEDULING.  °

## 2017-03-04 ENCOUNTER — Encounter: Payer: Self-pay | Admitting: Cardiology

## 2017-03-04 ENCOUNTER — Ambulatory Visit (INDEPENDENT_AMBULATORY_CARE_PROVIDER_SITE_OTHER): Payer: Medicare Other | Admitting: Cardiology

## 2017-03-04 VITALS — BP 118/62 | HR 78 | Ht 65.5 in | Wt 149.1 lb

## 2017-03-04 DIAGNOSIS — G4719 Other hypersomnia: Secondary | ICD-10-CM | POA: Diagnosis not present

## 2017-03-04 DIAGNOSIS — I959 Hypotension, unspecified: Secondary | ICD-10-CM | POA: Insufficient documentation

## 2017-03-04 DIAGNOSIS — R5383 Other fatigue: Secondary | ICD-10-CM | POA: Insufficient documentation

## 2017-03-04 NOTE — Patient Instructions (Signed)
Medication Instructions:  Your physician recommends that you continue on your current medications as directed. Please refer to the Current Medication list given to you today.   Labwork: None  Testing/Procedures: Dr. Radford Pax recommends you have a [place]. You will be called to arrange this test.  Follow-Up: Your physician recommends that you schedule a follow-up appointment AS NEEDED with Dr. Radford Pax pending study results.   Any Other Special Instructions Will Be Listed Below (If Applicable).     If you need a refill on your cardiac medications before your next appointment, please call your pharmacy.

## 2017-03-04 NOTE — Progress Notes (Signed)
Cardiology Office Note    Date:  03/04/2017   ID:  Dominic Garcia, DOB 07-12-1926, MRN [id number]  PCP:  Gennette Pac, MD  Cardiologist:  Fransico Him, MD   No chief complaint on file.   History of Present Illness:  Dominic Garcia is a 83 y.o. male with no prior cardiac history who is referred here for evaluation of fatigue.  According to his wife he has apparently  been more fatigued recently and taking more naps.  He has a history of hypothyroidism and is on thyroid replacement.  Recent TSH was normal at 2.14.  CMET and CBC were also normal.  He is now here for cardiac evaluation. She says that he fell asleep recently while sitting in the choir at choir practice.  His wife says that he snores at night especially when he is on his back.  He denies any awakening gasping for breath and his wife has not noticed any apneic spells.  He feels rested in the am.  He says that he gets sleepy during the day because of his thyroid abnormalities.  He denies any sleepiness when driving but he takes a nap before he goes driving.  He goes to bed at 10:30pm and falls asleep quickly and gets up 3 times during then night to go to the bathroom and gets up around 7am.  He denies any chest pain or pressure, SOB, DOE, LE edema, palpitations, dizziness or syncope.      Past Medical History:  Diagnosis Date  . Allergic rhinitis   . Allergic rhinoconjunctivitis   . Colon polyp 2003  . H/O peptic ulcer    at age 38  . Macular degeneration   . Macular degeneration, dry   . Melanoma ([place]) 2000   treated at [facility]  . Melanoma ([place])    [facility] at [facility]  . Mixed dyslipidemia   . Prostate hypertrophy   . Reflux   . Squamous cell carcinoma    on pts scalp- local derm Dr Denna Haggard  . Thyroid disease    hypothyroid  . Thyroid disease     Past Surgical History:  Procedure Laterality Date  . APPENDECTOMY  1937  . APPENDECTOMY    . CHOLECYSTECTOMY    . INGUINAL HERNIA REPAIR Left 2012   at [place]  .  KNEE ARTHROSCOPY Left 2000   Dr Telford Nab  . MELANOMA EXCISION     arm  . MELANOMA EXCISION    . TONSILLECTOMY  1935  . TONSILLECTOMY AND ADENOIDECTOMY      Current Medications: Current Meds  Medication Sig  . aspirin EC 81 MG tablet Take 81 mg by mouth 3 (three) times a week.  . Calcium Citrate (CITRACAL PO) Take 1 tablet by mouth daily.   . cyanocobalamin 1000 MCG tablet Take 1,000 mcg by mouth daily. 5000 units daily  . Glucosamine-Chondroitin (OSTEO BI-FLEX REGULAR STRENGTH) 250-200 MG TABS Take 1 capsule by mouth 2 (two) times daily.  Marland Kitchen levothyroxine (SYNTHROID, LEVOTHROID) 88 MCG tablet Take 88 mcg by mouth daily.  . memantine (NAMENDA) 10 MG tablet Take 1 tablet (10 mg total) by mouth 2 (two) times daily.  . Multiple Vitamins-Minerals (ICAPS PO) Take 1 capsule by mouth every morning.   Vladimir Faster Glycol-Propyl Glycol (SYSTANE OP) Apply to eye.  . vitamin C (ASCORBIC ACID) 500 MG tablet Take 500 mg by mouth daily.    Allergies:   Donepezil; Dextromethorphan-guaifenesin; Guaifenesin; and Mucinex [guaifenesin er]   Social History  Social History  . Marital status: Married    Spouse name: Real Cons  . Number of children: 3  . Years of education: 12   Occupational History  . other     [employer]   Social History Main Topics  . Smoking status: Former Smoker    Packs/day: 2.00    Years: 10.00    Types: Cigarettes  . Smokeless tobacco: Never Used  . Alcohol use No     Comment: Wine, occasionally  . Drug use: No  . Sexual activity: Not Asked   Other Topics Concern  . None   Social History Narrative   ** Merged History Encounter **       Pt lives at home with his spouse. Caffeine Use- Does not consume.     Family History:  The patient's family history includes Alzheimer's disease in his brother; Aneurysm in his father; Cancer in his mother; Colon cancer in his mother; Kidney Stones in his brother; Kidney disease in his father; Macular  degeneration in his brother; Renal Disease in his father; Stroke in his father.   ROS:   Please see the history of present illness.    ROS All other systems reviewed and are negative.  No flowsheet data found.     PHYSICAL EXAM:   VS:  BP 118/62   Pulse 78   Ht 5' 5.5" (1.664 m)   Wt 149 lb 1.9 oz (67.6 kg)   BMI 24.44 kg/m    GEN: Well nourished, well developed, in no acute distress  HEENT: normal  Neck: no JVD, carotid bruits, or masses Cardiac: RRR; no murmurs, rubs, or gallops,no edema.  Intact distal pulses bilaterally.  Respiratory:  clear to auscultation bilaterally, normal work of breathing GI: soft, nontender, nondistended, + BS MS: no deformity or atrophy  Skin: warm and dry, no rash Neuro:  Alert and Oriented x 3, Strength and sensation are intact Psych: euthymic mood, full affect  Wt Readings from Last 3 Encounters:  03/04/17 149 lb 1.9 oz (67.6 kg)  01/29/17 148 lb 6.4 oz (67.3 kg)  01/07/17 149 lb (67.6 kg)      Studies/Labs Reviewed:   EKG:  EKG is ordered today.  The ekg ordered today demonstrates NSR at 78bpm with no ST changes.  Recent Labs: No results found for requested labs within last 8760 hours.   Lipid Panel No results found for: CHOL, TRIG, HDL, CHOLHDL, VLDL, LDLCALC, LDLDIRECT  Additional studies/ records that were reviewed today include:  Office visit notes from PCP    ASSESSMENT:    1. Excessive daytime sleepiness      PLAN:  In order of problems listed above:  1. Excessive daytime sleepiness that is likely multifactorial from advancing age, poor sleep due to frequent trips to the bathroom at night, thyroid issues.  He does not really have any of the symptoms of OSA except for snoring that is abated on sleeping on his side.  I will get a home sleep study to see if he has any significant OSA.  He exercises for 1-2 miles on a treadmill frequently without any symptoms of exertional fatigue, CP or SOB.    Medication  Adjustments/Labs and Tests Ordered: Current medicines are reviewed at length with the patient today.  Concerns regarding medicines are outlined above.  Medication changes, Labs and Tests ordered today are listed in the Patient Instructions below.  Patient Instructions  Medication Instructions:  Your physician recommends that you continue on your current  medications as directed. Please refer to the Current Medication list given to you today.   Labwork: None  Testing/Procedures: Dr. Radford Pax recommends you have a [place]. You will be called to arrange this test.  Follow-Up: Your physician recommends that you schedule a follow-up appointment AS NEEDED with Dr. Radford Pax pending study results.   Any Other Special Instructions Will Be Listed Below (If Applicable).     If you need a refill on your cardiac medications before your next appointment, please call your pharmacy.      Signed, Fransico Him, MD  03/04/2017 3:37 PM    [facility] [place], [place],   [address] Phone: [phone]; Fax: [phone]

## 2017-03-11 ENCOUNTER — Telehealth: Payer: Self-pay | Admitting: Diagnostic Neuroimaging

## 2017-03-11 NOTE — Telephone Encounter (Signed)
Returned wife's call and gave her multiple names and numbers for Senior Resources and dementia support groups as well. She verbalized appreciation.

## 2017-03-11 NOTE — Telephone Encounter (Signed)
Pt wife would like to know if there is a local Support group for her husbands diagnosis

## 2017-03-20 ENCOUNTER — Telehealth: Payer: Self-pay | Admitting: Cardiology

## 2017-03-20 ENCOUNTER — Telehealth: Payer: Self-pay | Admitting: *Deleted

## 2017-03-20 NOTE — Telephone Encounter (Signed)
Patients wife called today to inform us that the patient was too busy to test tonight with [facility]. [facility] has been notified by the patient and [facility] sent [place] paperwork.The patient will call back to set up testing

## 2017-03-20 NOTE — Telephone Encounter (Signed)
New message   Pt wife is calling with questions about the home sleep study he has for tonight.

## 2017-03-21 NOTE — Telephone Encounter (Signed)
Late Entry: Called the patients wife back to address his concerns about his sleep study. He wanted to cancel his sleep study because he is too busy at this time to do it. He will call back to reschedule

## 2017-03-26 ENCOUNTER — Telehealth: Payer: Self-pay | Admitting: *Deleted

## 2017-03-26 NOTE — Telephone Encounter (Signed)
Late Entry:   A fax came in from [facility]  on 03/20/2017 for this patient to say his sleep study has been cancelled with the reason being "the patient is too busy to test at this time, he will call back to set up testing". I calld and spoke to his wife per DPR and she said they will call back at a later date

## 2017-04-17 DIAGNOSIS — E039 Hypothyroidism, unspecified: Secondary | ICD-10-CM | POA: Diagnosis not present

## 2017-04-17 DIAGNOSIS — E782 Mixed hyperlipidemia: Secondary | ICD-10-CM | POA: Diagnosis not present

## 2017-04-17 DIAGNOSIS — H6061 Unspecified chronic otitis externa, right ear: Secondary | ICD-10-CM | POA: Diagnosis not present

## 2017-04-17 DIAGNOSIS — H6122 Impacted cerumen, left ear: Secondary | ICD-10-CM | POA: Diagnosis not present

## 2017-04-17 DIAGNOSIS — R5383 Other fatigue: Secondary | ICD-10-CM | POA: Diagnosis not present

## 2017-04-17 DIAGNOSIS — Z79899 Other long term (current) drug therapy: Secondary | ICD-10-CM | POA: Diagnosis not present

## 2017-04-24 DIAGNOSIS — R413 Other amnesia: Secondary | ICD-10-CM | POA: Diagnosis not present

## 2017-04-24 DIAGNOSIS — E039 Hypothyroidism, unspecified: Secondary | ICD-10-CM | POA: Diagnosis not present

## 2017-04-24 DIAGNOSIS — Z85820 Personal history of malignant melanoma of skin: Secondary | ICD-10-CM | POA: Diagnosis not present

## 2017-04-24 DIAGNOSIS — Z79899 Other long term (current) drug therapy: Secondary | ICD-10-CM | POA: Diagnosis not present

## 2017-04-24 DIAGNOSIS — N400 Enlarged prostate without lower urinary tract symptoms: Secondary | ICD-10-CM | POA: Diagnosis not present

## 2017-04-24 DIAGNOSIS — Z0000 Encounter for general adult medical examination without abnormal findings: Secondary | ICD-10-CM | POA: Diagnosis not present

## 2017-04-24 DIAGNOSIS — E782 Mixed hyperlipidemia: Secondary | ICD-10-CM | POA: Diagnosis not present

## 2017-05-01 ENCOUNTER — Telehealth: Payer: Self-pay | Admitting: Diagnostic Neuroimaging

## 2017-05-01 NOTE — Telephone Encounter (Signed)
Patients wife called office in reference to see if patient is able to have a MRI or why he hasn't had any testing done (not specific on why or what kind of other test).  Patient states patient is in denial.  Please call

## 2017-05-01 NOTE — Telephone Encounter (Signed)
Pt last seen on 01/29/17 for mild dementia. Wife noted that he was having trouble staying on task, finding things, connecting ideas of situations together. listening and understanding. Scored 26/30 on MMSE. He has memantine 10 mg prescribed BID and a follow-up appt currently scheduled in August. Wife is now requesting further testing.

## 2017-05-02 NOTE — Telephone Encounter (Signed)
Patient had MRI in 2014. No need for repeat MRI. He tried and failed aricept. No further testing advised. She will need more social support. Please give dementia resources for her. If needed, they can setup an appt or phone call with me in next 4 weeks. -VRP

## 2017-05-05 NOTE — Telephone Encounter (Signed)
Returned call to pt's wife who feels that memory/ confusion has recently gotten worse. Reviewed 2014 MRI w/ her and meds for memory. Answered questions about Namenda dosing and recommended that pt continue it twice per day, taking in the morning and the evening. Offered sooner appt but wife declined at this time. Plan to keep appt scheduled in August and will call back if she can talk pt into being seen sooner.

## 2017-06-24 ENCOUNTER — Telehealth: Payer: Self-pay | Admitting: Diagnostic Neuroimaging

## 2017-06-24 MED ORDER — MEMANTINE HCL ER 28 MG PO CP24: 28.0000 mg | ORAL_CAPSULE | Freq: Every day | ORAL | 12 refills | Status: DC

## 2017-06-24 NOTE — Telephone Encounter (Signed)
Ok to switch to memantine XR 28mg  daily.   -VRP

## 2017-06-24 NOTE — Telephone Encounter (Signed)
I called and spoke to wife and she would like [place] to call and relay this to pt. I called and spoke to pt and relayed that DrMarland Kitchen Leta Baptist wanted him to start on memantine 28mg  ER once daily.(increase from 10mg  po bid).    Pharmacy will call and let you know that it is ready.  Pt verbalized understanding.

## 2017-06-24 NOTE — Telephone Encounter (Signed)
I called and spoke to wife.  Pt is not connnecting, not following commands,  Will respond sometimes and then sometimes forgets to respond also cannot find things.  This is part of disease progression.  I told her about namenda 28mg  ER tab once daily.  She would like to try.  If needs appt can see MM/NP ?

## 2017-06-24 NOTE — Addendum Note (Signed)
Addended by: Andrey Spearman R on: 06/24/2017 02:13 PM   Modules accepted: Orders

## 2017-06-24 NOTE — Telephone Encounter (Signed)
Patient's wife calling to see if the patient's memantine (NAMENDA) 10 MG tablet can be increased. He is having memory issues and gets agitated easily. Please call and discuss.

## 2017-06-27 ENCOUNTER — Telehealth: Payer: Self-pay | Admitting: Diagnostic Neuroimaging

## 2017-06-27 NOTE — Telephone Encounter (Signed)
Patient called regarding his medication. He states that he was prescribed a new medication and wants to know if he should continue taking the old medication. He believes that it is namenda but he is not sure. Please call and advise.

## 2017-06-30 NOTE — Telephone Encounter (Signed)
Spoke to pt and clarified that he is to take the memantine 28 XR instead of the memantine 10mg  po bid. He verbalized understanding.

## 2017-07-17 NOTE — Telephone Encounter (Signed)
Pt wife calling re: the memantine stating that it does not seem to be as effective as the medication pt was previously on, she is asking to be called on her mobile.

## 2017-07-17 NOTE — Telephone Encounter (Signed)
LMVM for wife to return call.  

## 2017-07-18 NOTE — Telephone Encounter (Signed)
Pt wife asking for a call back to discuss her concern  About the dosage of memantine (NAMENDA XR) 28 MG CP24 24 hr capsule, pt wife feels the pt is more confused and would like to discuss, please call

## 2017-07-18 NOTE — Telephone Encounter (Signed)
Spoke with wife who stated she is giving patient Namenda 28 mg once a day, but he seems more confused. She is requesting he be seen sooner. Moved FU to two weeks sooner. Advised her they need to arrive 30 minutes early, and that they will get a reminder call 2 days ahead of time. She verbalized understanding, appreciation.

## 2017-07-30 ENCOUNTER — Ambulatory Visit: Payer: Self-pay | Admitting: Diagnostic Neuroimaging

## 2017-08-01 DIAGNOSIS — Z85820 Personal history of malignant melanoma of skin: Secondary | ICD-10-CM | POA: Diagnosis not present

## 2017-08-11 DIAGNOSIS — R490 Dysphonia: Secondary | ICD-10-CM | POA: Diagnosis not present

## 2017-08-13 ENCOUNTER — Ambulatory Visit (INDEPENDENT_AMBULATORY_CARE_PROVIDER_SITE_OTHER): Payer: Medicare Other | Admitting: Diagnostic Neuroimaging

## 2017-08-13 ENCOUNTER — Encounter: Payer: Self-pay | Admitting: Diagnostic Neuroimaging

## 2017-08-13 ENCOUNTER — Encounter (INDEPENDENT_AMBULATORY_CARE_PROVIDER_SITE_OTHER): Payer: Self-pay

## 2017-08-13 ENCOUNTER — Ambulatory Visit: Payer: Medicare Other | Admitting: Diagnostic Neuroimaging

## 2017-08-13 VITALS — BP 85/51 | HR 71 | Wt 145.2 lb

## 2017-08-13 DIAGNOSIS — F0390 Unspecified dementia without behavioral disturbance: Secondary | ICD-10-CM | POA: Diagnosis not present

## 2017-08-13 DIAGNOSIS — F03A0 Unspecified dementia, mild, without behavioral disturbance, psychotic disturbance, mood disturbance, and anxiety: Secondary | ICD-10-CM

## 2017-08-13 MED ORDER — MEMANTINE HCL ER 28 MG PO CP24: 28.0000 mg | ORAL_CAPSULE | Freq: Every day | ORAL | 4 refills | Status: DC

## 2017-08-13 NOTE — Patient Instructions (Signed)
-   continue memantine  - no driving

## 2017-08-13 NOTE — Progress Notes (Signed)
[facility]  PATIENT: Dominic Garcia DOB: July 30, 1926  REFERRING CLINICIAN:  HISTORY FROM: patient and wife REASON FOR VISIT: follow up    HISTORICAL  CHIEF COMPLAINT:  Chief Complaint  Patient presents with  . Dementia    rm 6, wifeLelon Frohlich  MMSE 21, needs refill of namenda  . Follow-up    6 month    HISTORY OF PRESENT ILLNESS:   UPDATE 08/13/17: Since last visit overall stable. Still repeating himself. Cannot keep up with appts. Still with significant stress with wife. Tolerating meds. No major changes in ADLs since last visit.   UPDATE 01/29/17: Since last visit, patient feels stable. Wife notes that pt is having trouble staying on task, finding things, repeating himself, having trouble connecting ideas of situations together. Pt having trouble listening and understanding.   UPDATE 08/13/16: Since last visit, symptoms continue. Memantine is well tolerated. Patient continuing most of his ADLs. Wife notes more repeating himself and asking same questions over and over.   UPDATE 04/15/16: Patient returns for follow-up. Patient denies any significant memory problems. Patient's wife is concerned about progressive short-term memory problems, repeating himself, and possibility of underlying neurodegenerative condition. Apparently patient's PCP tried him on donepezil since last visit, the patient could not tolerate due to side effect of balance difficulty. Patient maintaining most of his activities of daily living including bathing, dressing, eating, driving, maintaining finances, working outside the home.  UPDATE 10/19/13: Since last visit, doing well. No further syncope events. Wife is here today for appt. She acknowledges that patient is having more memory problems over last 1 year. He is back to driving.  PRIOR HPI (04/02/13): 83 year old right-handed male with hypothyroidism, melanoma, here for evaluation of passing out episode. Patient got lost while coming to the  appointment. He then delayed the beginning of the appointment because he insisted on going to the car to bring an allergy medication yet been taking. Patient repeated himself several times throughout our examination. Unfortunately his wife did not come to this office visit to provide collateral history. I reviewed the hospital records, referring PCP notes, imaging, labs myself. In summary, patient is here for evaluation of unprovoked syncopal event on 02/27/2013. That morning he woke up with a sore throat and was not feeling well. He had been climbing on a ladder fixing a ceiling fan and had come down off of the ladder. He was standing next to the ladder when without warning, prodromal dizziness or lightheadedness, he passed out and collapsed to the ground. His wife helped him into a chair and then he fell off again. No tongue biting or incontinence. Unclear if there were convulsions or not.   REVIEW OF SYSTEMS: Full 14 system review of systems performed and negative except for: snoring memory loss blurred vision feeling cold allergies impotence.    ALLERGIES: Allergies  Allergen Reactions  . Donepezil Other (See Comments)    Made pt wobbly  . Dextromethorphan-Guaifenesin Rash  . Guaifenesin Rash and Swelling  . Mucinex [Guaifenesin Er] Swelling and Rash    HOME MEDICATIONS: Outpatient Medications Prior to Visit  Medication Sig Dispense Refill  . aspirin EC 81 MG tablet Take 81 mg by mouth 3 (three) times a week.    . Calcium Citrate (CITRACAL PO) Take 1 tablet by mouth daily.     . cyanocobalamin 1000 MCG tablet Take 1,000 mcg by mouth daily. 5000 units daily    . Glucosamine-Chondroitin (OSTEO BI-FLEX REGULAR STRENGTH) 250-200 MG TABS Take 1 capsule  by mouth 2 (two) times daily.    Marland Kitchen levothyroxine (SYNTHROID, LEVOTHROID) 88 MCG tablet Take 88 mcg by mouth daily.    . memantine (NAMENDA XR) 28 MG CP24 24 hr capsule Take 1 capsule (28 mg total) by mouth daily. 30 capsule 12  . Multiple  Vitamins-Minerals (ICAPS PO) Take 1 capsule by mouth every morning.     Vladimir Faster Glycol-Propyl Glycol (SYSTANE OP) Apply to eye.    . vitamin C (ASCORBIC ACID) 500 MG tablet Take 500 mg by mouth daily.     No facility-administered medications prior to visit.     PAST MEDICAL HISTORY: Past Medical History:  Diagnosis Date  . Allergic rhinitis   . Allergic rhinoconjunctivitis   . Colon polyp 2003  . H/O peptic ulcer    at age 89  . Macular degeneration   . Macular degeneration, dry   . Melanoma ([place]) 2000   treated at [facility]  . Melanoma ([place])    treated at [facility]  . Memory loss   . Mixed dyslipidemia   . Prostate hypertrophy   . Reflux   . Squamous cell carcinoma    on pts scalp- local derm Dr Denna Haggard  . Thyroid disease    hypothyroid  . Thyroid disease     PAST SURGICAL HISTORY: Past Surgical History:  Procedure Laterality Date  . APPENDECTOMY  1937  . APPENDECTOMY    . CHOLECYSTECTOMY    . INGUINAL HERNIA REPAIR Left 2012   at [place]  . KNEE ARTHROSCOPY Left 2000   Dr Telford Nab  . MELANOMA EXCISION     arm  . MELANOMA EXCISION    . TONSILLECTOMY  1935  . TONSILLECTOMY AND ADENOIDECTOMY      FAMILY HISTORY: Family History  Problem Relation Age of Onset  . Cancer Mother   . Kidney disease Father   . Macular degeneration Brother   . Alzheimer's disease Brother   . Renal Disease Father   . Aneurysm Father   . Stroke Father   . Colon cancer Mother   . Kidney Stones Brother     SOCIAL HISTORY:  Social History   Social History  . Marital status: Married    Spouse name: Real Cons  . Number of children: 3  . Years of education: 44   Occupational History  . other     [employer]   Social History Main Topics  . Smoking status: Former Smoker    Packs/day: 2.00    Years: 10.00    Types: Cigarettes  . Smokeless tobacco: Never Used  . Alcohol use No     Comment: Wine, occasionally  . Drug use: No  . Sexual activity: Not on file    Other Topics Concern  . Not on file   Social History Narrative   ** Merged History Encounter **       Pt lives at home with his spouse. Caffeine Use- Does not consume.     PHYSICAL EXAM  Vitals:   08/13/17 0942  BP: (!) 85/51  Pulse: 71  Weight: 145 lb 3.2 oz (65.9 kg)   Body mass index is 23.8 kg/m.  MMSE - Mini Mental State Exam 08/13/2017 01/29/2017 08/13/2016  Orientation to time 4 5 4   Orientation to Place 5 5 4   Registration 2 3 3   Attention/ Calculation 2 4 4   Recall 0 0 0  Language- name 2 objects 2 2 2   Language- repeat 0 1 0  Language- follow 3 step  command 3 3 3   Language- read & follow direction 1 1 1   Write a sentence 1 1 1   Copy design 1 1 0  Total score 21 26 22     GENERAL EXAM: Patient is in no distress  CARDIOVASCULAR: Regular rate and rhythm, no murmurs, no carotid bruits  NEUROLOGIC: MENTAL STATUS: awake, alert, language fluent, comprehension intact, naming intact; ARGUMENTATIVE TOWARDS HIS WIFE; POOR INSIGHT CRANIAL NERVE: pupils equal and reactive to light, visual fields full to confrontation, extraocular muscles intact, no nystagmus, facial sensation and strength symmetric, uvula midline, shoulder shrug symmetric, tongue midline. MOTOR: normal bulk and tone, full strength in the BUE, BLE SENSORY: normal and symmetric to light touch, temperature, vibration COORDINATION: finger-nose-finger, fine finger movements normal REFLEXES: deep tendon reflexes present and symmetric GAIT/STATION: narrow based gait    DIAGNOSTIC DATA (LABS, IMAGING, TESTING) - I reviewed patient records, labs, notes, testing and imaging myself where available.  Lab Results  Component Value Date   WBC 9.8 09/03/2014   HGB 13.7 09/03/2014   HCT 39.5 09/03/2014   MCV 92.9 09/03/2014   PLT 220 09/03/2014      Component Value Date/Time   NA 138 09/03/2014 2316   K 4.5 09/03/2014 2316   CL 101 09/03/2014 2316   CO2 27 09/03/2014 2316   GLUCOSE 98 09/03/2014 2316    BUN 20 09/03/2014 2316   CREATININE 1.13 09/03/2014 2316   CALCIUM 9.0 09/03/2014 2316   PROT 7.0 02/27/2013 1505   ALBUMIN 3.7 02/27/2013 1505   AST 22 02/27/2013 1505   ALT 15 02/27/2013 1505   ALKPHOS 77 02/27/2013 1505   BILITOT 0.4 02/27/2013 1505   GFRNONAA 57 (L) 09/03/2014 2316   GFRAA 66 (L) 09/03/2014 2316   No results found for: CHOL No results found for: HGBA1C No results found for: VITAMINB12 No results found for: TSH  03/29/13 MRI brain - mild atrophy, mild chronic small vessel ischemic disease, mineralization of basal ganglia  04/16/13 EEG - normal    ASSESSMENT AND PLAN  83 y.o. year old male  has a past medical history of Allergic rhinitis; Allergic rhinoconjunctivitis; Colon polyp (2003); H/O peptic ulcer; Macular degeneration; Macular degeneration, dry; Melanoma ([place]) (2000); Melanoma ([place]); Memory loss; Mixed dyslipidemia; Prostate hypertrophy; Reflux; Squamous cell carcinoma; Thyroid disease; and Thyroid disease. here with unprovoked syncopal event on 02/27/2013, unclear etiology. Also with progressive short term memory problems, confusion, with history and exam suggesting mild dementia.  Meds tried and failed: donepezil (off balance)   Dx:  Mild dementia    PLAN:  I spent 25 minutes of face to face time with patient. Greater than 50% of time was spent in counseling and coordination of care with patient. In summary we discussed:  - continue memantine (started in April 2017) --> will refill today and ask PCP to continue going forward - considered research studies, but patient has poor insight and is resistant to any changes or participation - caution with safety, supervision, driving, finances; no driving - resources given to patient and wife - advised wife to seek help for caregiver support  Meds ordered this encounter  Medications  . memantine (NAMENDA XR) 28 MG CP24 24 hr capsule    Sig: Take 1 capsule (28 mg total) by mouth daily.    Dispense:   90 capsule    Refill:  4    Return if symptoms worsen or fail to improve, for return to PCP.    Penni Bombard, MD 08/13/2017, 10:09 AM  Certified in Neurology, Neurophysiology and [facility] [address], [place], [place] [address] [phone]

## 2017-08-21 NOTE — Telephone Encounter (Signed)
Spoke with wife who stated her husband's memory issues seem to be progressing rapidly. She is asking if another medication can be added or a different medication be prescribed. She also stated the Namenda was around $300/month.  This RN advised her that the medications will not reverse or stop memory loss but hopefully slow it down. She stated it seems to be worsening at a rapid pace, and she knows other medications are available for dementia.  This RN advised that re: Namenda cost, she should check other pharmacies. Advised will discuss with Dr Leta Baptist and call her back. Informed her this office closes at noon tomorrow and is closed Monday, holiday. She verbalized understanding, appreciation.

## 2017-08-21 NOTE — Telephone Encounter (Signed)
-   we may change namenda xr to memantine 10mg  BID (generic) - he did not tolerate donepezil; we could try exelon patch, but likely will be expensive  Penni Bombard, MD 7/86/7544, 9:20 PM Certified in Neurology, Neurophysiology and Neuroimaging  [facility] [address] [place], [place] [address] [phone]

## 2017-08-21 NOTE — Telephone Encounter (Signed)
Spoke with wife and gave her Dr Gladstone Lighter reply. She wrote down names of medicaitons, stated she would check prices and call back with her decision. She verbalized understanding, appreciation of call back.

## 2017-08-21 NOTE — Telephone Encounter (Signed)
Pt wife is asking for a call  Back to discuss memantine (NAMENDA XR) 28 MG CP24 24 hr capsule

## 2017-08-22 DIAGNOSIS — E039 Hypothyroidism, unspecified: Secondary | ICD-10-CM | POA: Diagnosis not present

## 2017-08-22 DIAGNOSIS — I959 Hypotension, unspecified: Secondary | ICD-10-CM | POA: Diagnosis not present

## 2017-08-26 ENCOUNTER — Telehealth: Payer: Self-pay

## 2017-08-26 NOTE — Telephone Encounter (Signed)
SENT NOTES TO SCHEDULING 

## 2017-09-01 ENCOUNTER — Ambulatory Visit: Payer: Medicare Other | Admitting: Physician Assistant

## 2017-09-01 NOTE — Progress Notes (Deleted)
Cardiology Office Note    Date:  09/01/2017   ID:  Dominic Garcia, DOB August 26, 1926, MRN [id number]  PCP:  Hulan Fess, MD  Cardiologist:   No chief complaint on file.   History of Present Illness:  Dominic Garcia is a 83 y.o. male ***    Past Medical History:  Diagnosis Date  . Allergic rhinitis   . Allergic rhinoconjunctivitis   . Colon polyp 2003  . H/O peptic ulcer    at age 51  . Macular degeneration   . Macular degeneration, dry   . Melanoma ([place]) 2000   treated at [facility]  . Melanoma ([place])    treated at [facility]  . Memory loss   . Mixed dyslipidemia   . Prostate hypertrophy   . Reflux   . Squamous cell carcinoma    on pts scalp- local derm Dr Denna Haggard  . Thyroid disease    hypothyroid  . Thyroid disease     Past Surgical History:  Procedure Laterality Date  . APPENDECTOMY  1937  . APPENDECTOMY    . CHOLECYSTECTOMY    . INGUINAL HERNIA REPAIR Left 2012   at [place]  . KNEE ARTHROSCOPY Left 2000   Dr Telford Nab  . MELANOMA EXCISION     arm  . MELANOMA EXCISION    . TONSILLECTOMY  1935  . TONSILLECTOMY AND ADENOIDECTOMY      Current Medications: No outpatient prescriptions have been marked as taking for the 09/01/17 encounter (Appointment) with Imogene Burn, PA-C.     Allergies:   Donepezil; Dextromethorphan-guaifenesin; Guaifenesin; and Mucinex [guaifenesin er]   Social History   Social History  . Marital status: Married    Spouse name: Real Cons  . Number of children: 3  . Years of education: 87   Occupational History  . other     [employer]   Social History Main Topics  . Smoking status: Former Smoker    Packs/day: 2.00    Years: 10.00    Types: Cigarettes  . Smokeless tobacco: Never Used  . Alcohol use No     Comment: Wine, occasionally  . Drug use: No  . Sexual activity: Not on file   Other Topics Concern  . Not on file   Social History Narrative   ** Merged History Encounter **       Pt lives at  home with his spouse. Caffeine Use- Does not consume.     Family History:  The patient's ***family history includes Alzheimer's disease in his brother; Aneurysm in his father; Cancer in his mother; Colon cancer in his mother; Kidney Stones in his brother; Kidney disease in his father; Macular degeneration in his brother; Renal Disease in his father; Stroke in his father.   ROS:   Please see the history of present illness.    ROS All other systems reviewed and are negative.   PHYSICAL EXAM:   VS:  There were no vitals taken for this visit.  Physical Exam  GEN: Well nourished, well developed, in no acute distress  HEENT: normal  Neck: no JVD, carotid bruits, or masses Cardiac:RRR; no murmurs, rubs, or gallops  Respiratory:  clear to auscultation bilaterally, normal work of breathing GI: soft, nontender, nondistended, + BS Ext: without cyanosis, clubbing, or edema, Good distal pulses bilaterally MS: no deformity or atrophy  Skin: warm and dry, no rash Neuro:  Alert and Oriented x 3, Strength and sensation are intact Psych: euthymic mood, full affect  Wt Readings from Last 3 Encounters:  08/13/17 145 lb 3.2 oz (65.9 kg)  03/04/17 149 lb 1.9 oz (67.6 kg)  01/29/17 148 lb 6.4 oz (67.3 kg)      Studies/Labs Reviewed:   EKG:  EKG is*** ordered today.  The ekg ordered today demonstrates ***  Recent Labs: No results found for requested labs within last 8760 hours.   Lipid Panel No results found for: CHOL, TRIG, HDL, CHOLHDL, VLDL, LDLCALC, LDLDIRECT  Additional studies/ records that were reviewed today include:  ***    ASSESSMENT:    No diagnosis found.   PLAN:  In order of problems listed above:      Medication Adjustments/Labs and Tests Ordered: Current medicines are reviewed at length with the patient today.  Concerns regarding medicines are outlined above.  Medication changes, Labs and Tests ordered today are listed in the Patient Instructions below. There are  no Patient Instructions on file for this visit.   Signed, Ermalinda Barrios, PA-C  09/01/2017 1:59 PM    [facility] [place], [place],   [address] Phone: [phone]; Fax: [phone]

## 2017-09-02 NOTE — Telephone Encounter (Signed)
Pt's wife called in she does not think memantine 28mg  qd is helping. She is wanting to discuss the exelon patch. Please call her at [phone] tomorrow

## 2017-09-03 NOTE — Telephone Encounter (Signed)
Spoke to wife.  She stated that she will investigate the exelon patches (pricing).  She may use [facility].  I relayed that insurance may not approve, require PA and may not be on formulary (they may recommend generic).   She will let us know.

## 2017-09-04 NOTE — Telephone Encounter (Signed)
I spoke to wife and relayed that below per Dr. Gladstone Lighter recommendation.  She verbalized understanding.  I asked her if she had the ALZpacket that had caregiver resources in for her and she stated she did not think so.  I will mail her a copy.  Pt to keep on memantine 28mg  po daily as previously ordered.  She also verbalized understanding of this as well.  She has no one locally to help.  She attends a support group for ALZ .

## 2017-09-04 NOTE — Telephone Encounter (Signed)
I spoke to wife and she would like to see what the price for exelon patches are , prescription needed.

## 2017-09-04 NOTE — Telephone Encounter (Signed)
I do not recommend exelon patch because it is in the same family as donepezil (which patient tried but had side effects of poor balance). Also patient has had syncope in the past, and had low BP at our last office visit, and therefore I would be concerned that exelon patch could trigger syncope for patient.  No other dementia medications are available or advised at this time from my standpoint.  Please provide caregiver support resources to spouse.   Penni Bombard, MD 4/93/5521, 74:71 PM Certified in Neurology, Neurophysiology and Neuroimaging  [facility] [address] [place], [place] [address] [phone]

## 2017-09-04 NOTE — Telephone Encounter (Signed)
Pt's wife called in she cannot get pricing without RX. Call transferred to RN

## 2017-09-08 ENCOUNTER — Ambulatory Visit (INDEPENDENT_AMBULATORY_CARE_PROVIDER_SITE_OTHER): Payer: Medicare Other | Admitting: Physician Assistant

## 2017-09-08 ENCOUNTER — Encounter: Payer: Self-pay | Admitting: Physician Assistant

## 2017-09-08 VITALS — BP 110/52 | HR 70 | Ht 67.0 in | Wt 146.0 lb

## 2017-09-08 DIAGNOSIS — G4719 Other hypersomnia: Secondary | ICD-10-CM

## 2017-09-08 DIAGNOSIS — I959 Hypotension, unspecified: Secondary | ICD-10-CM | POA: Diagnosis not present

## 2017-09-08 DIAGNOSIS — E039 Hypothyroidism, unspecified: Secondary | ICD-10-CM | POA: Diagnosis not present

## 2017-09-08 NOTE — Progress Notes (Signed)
Cardiology Office Note    Date:  09/08/2017   ID:  Dominic Garcia, DOB [birth year], MRN [id number]  PCP:  Hulan Fess, MD  Cardiologist: Dr. Fransico Him  Chief Complaint  Patient presents with  . Hypotension    History of Present Illness:  Dominic Garcia is a 83 y.o. male who saw Dr. Radford Pax 03/04/17 with history of excessive daytime sleepiness felt secondary to advancing age, poor sleep due to frequent trips to the bathroom at night and thyroid issues. She ordered a home sleep study but this was never done.2-D echo in 2014 LVEF 65-70% with grade 1 DD and mild MR.  Dominic Garcia is a 83 y.o. male who is being seen today for the evaluation of hypotension. at the request of Little, Lennette Bihari, MD.Patient is here accompanied by his wife. Are free view the notes from Dr. Rex Kras. 08/22/17 the wife is concerned because he was having more sleepiness during the day. His blood pressure was 106/64 lying 90/64 sitting and 90/60 standing. Chest X ray was normal EKG was normal CBC, chemistries and thyroid function were all normal.  Patient comes in today and tells me he is totally asymptomatic. He denies any dizziness, or presyncope. They have a blood pressure cuff but don't think it's accurate. They need to get a calibrated. He swims 10-20 laps daily and still works for an [employer]. He tries to drink 20 ounces of water a day but does not do this if he has an appointment because of his enlarged prostate.     Past Medical History:  Diagnosis Date  . Allergic rhinitis   . Allergic rhinoconjunctivitis   . Colon polyp 2003  . H/O peptic ulcer    at age 35  . Macular degeneration   . Macular degeneration, dry   . Melanoma ([place]) 2000   treated at [facility]  . Melanoma ([place])    treated at [facility]  . Memory loss   . Mixed dyslipidemia   . Prostate hypertrophy   . Reflux   . Squamous cell carcinoma    on pts scalp- local derm Dr Denna Haggard  . Thyroid disease    hypothyroid  . Thyroid  disease     Past Surgical History:  Procedure Laterality Date  . APPENDECTOMY  1937  . APPENDECTOMY    . CHOLECYSTECTOMY    . INGUINAL HERNIA REPAIR Left 2012   at [place]  . KNEE ARTHROSCOPY Left 2000   Dr Telford Nab  . MELANOMA EXCISION     arm  . MELANOMA EXCISION    . TONSILLECTOMY  1935  . TONSILLECTOMY AND ADENOIDECTOMY      Current Medications: Current Meds  Medication Sig  . aspirin EC 81 MG tablet Take 81 mg by mouth 3 (three) times a week.  . Calcium Citrate (CITRACAL PO) Take 1 tablet by mouth daily.   . cyanocobalamin 1000 MCG tablet Take 1,000 mcg by mouth daily. 5000 units daily  . Glucosamine-Chondroitin (OSTEO BI-FLEX REGULAR STRENGTH) 250-200 MG TABS Take 1 capsule by mouth 2 (two) times daily.  Marland Kitchen levothyroxine (SYNTHROID, LEVOTHROID) 88 MCG tablet Take 88 mcg by mouth daily.  . memantine (NAMENDA XR) 28 MG CP24 24 hr capsule Take 1 capsule (28 mg total) by mouth daily.  . Multiple Vitamins-Minerals (ICAPS PO) Take 1 capsule by mouth every morning.   Vladimir Faster Glycol-Propyl Glycol (SYSTANE OP) Apply to eye.  . vitamin C (ASCORBIC ACID) 500 MG tablet Take 500 mg by  mouth daily.     Allergies:   Donepezil; Dextromethorphan-guaifenesin; Guaifenesin; and Mucinex [guaifenesin er]   Social History   Social History  . Marital status: Married    Spouse name: Real Cons  . Number of children: 3  . Years of education: 75   Occupational History  . other     [employer]   Social History Main Topics  . Smoking status: Former Smoker    Packs/day: 2.00    Years: 10.00    Types: Cigarettes  . Smokeless tobacco: Never Used  . Alcohol use No     Comment: Wine, occasionally  . Drug use: No  . Sexual activity: Not Asked   Other Topics Concern  . None   Social History Narrative   ** Merged History Encounter **       Pt lives at home with his spouse. Caffeine Use- Does not consume.     Family History:  The patient's family history includes  Alzheimer's disease in his brother; Aneurysm in his father; Cancer in his mother; Colon cancer in his mother; Kidney Stones in his brother; Kidney disease in his father; Macular degeneration in his brother; Renal Disease in his father; Stroke in his father.   ROS:   Please see the history of present illness.    Review of Systems  Constitution: Negative.  HENT: Negative.   Cardiovascular: Negative.   Respiratory: Negative.   Endocrine: Negative.   Hematologic/Lymphatic: Negative.   Musculoskeletal: Negative.   Gastrointestinal: Negative.   Genitourinary: Positive for frequency, nocturia and urgency.  Neurological: Negative.   Psychiatric/Behavioral: Positive for memory loss.   All other systems reviewed and are negative.   PHYSICAL EXAM:   VS:  BP (!) 110/52   Pulse 70   Ht 5\' 7"  (1.702 m)   Wt 146 lb (66.2 kg)   SpO2 97%   BMI 22.87 kg/m   Physical Exam  GEN: Well nourished, well developed, in no acute distress  Neck: no JVD, carotid bruits, or masses Cardiac:RRR; no murmurs, rubs, or gallops  Respiratory:  clear to auscultation bilaterally, normal work of breathing GI: soft, nontender, nondistended, + BS Ext: without cyanosis, clubbing, or edema, Good distal pulses bilaterally MS: no deformity or atrophy  Skin: warm and dry, no rash Neuro:  Alert and Oriented x 3, Strength and sensation are intact Psych: euthymic mood, full affect  Wt Readings from Last 3 Encounters:  09/08/17 146 lb (66.2 kg)  08/13/17 145 lb 3.2 oz (65.9 kg)  03/04/17 149 lb 1.9 oz (67.6 kg)      Studies/Labs Reviewed:   EKG:  EKG is not ordered today.  The ekg reviewed from 08/22/17 normal sinus rhythm, normal EKG  Recent Labs: No results found for requested labs within last 8760 hours.   Lipid Panel No results found for: CHOL, TRIG, HDL, CHOLHDL, VLDL, LDLCALC, LDLDIRECT  Additional studies/ records that were reviewed today include:  2-D echo 2014Study Conclusions  - Left ventricle: The  cavity size was normal. Wall thickness   was normal. Systolic function was vigorous. The estimated   ejection fraction was in the range of 65% to 70%. Wall   motion was normal; there were no regional wall motion   abnormalities. Doppler parameters are consistent with   abnormal left ventricular relaxation (grade 1 diastolic   dysfunction). The E/e' ratio is <10, suggesting normal LV   filling pressure. - Mitral valve: Mildly thickened leaflets . Mild   regurgitation. - Left atrium: The  atrium was normal in size. - Right ventricle: The cavity size was mildly dilated. Wall   thickness was normal. Systolic function was normal. - Tricuspid valve: Trivial regurgitation. - Pulmonic valve: Trivial regurgitation. - Pulmonary arteries: PA peak pressure: 81mm Hg (S). - Inferior vena cava: The vessel was normal in size; the   respirophasic diameter changes were in the normal range (=   50%); findings are consistent with normal central venous   pressure.     ASSESSMENT:    1. Hypotension, unspecified hypotension type   2. Hypothyroidism, unspecified type   3. Excessive daytime sleepiness      PLAN:  In order of problems listed above:  Hypotension on 08/22/17. May have been dehydrated as he limits his water intake when he travels or goes to an appointment. Patient's blood pressure is stable today. He and his wife both voice that he is asymptomatic and does not have any dizziness. Recommend compression stockings which he does not want to wear and stay hydrated. Would not treat asymptomatic hypotension unless he becomes dizzy. They will also calibrate his blood pressure cuff. Follow-up with [place] as needed.  Hypothyroidism treated by primary care  Excessive a time sleepiness has been ongoing problem that could be related to his age. Has declined sleep study.    Medication Adjustments/Labs and Tests Ordered: Current medicines are reviewed at length with the patient today.  Concerns regarding  medicines are outlined above.  Medication changes, Labs and Tests ordered today are listed in the Patient Instructions below. Patient Instructions  Medication Instructions:  Your physician recommends that you continue on your current medications as directed. Please refer to the Current Medication list given to you today.   Labwork: None ordered  Testing/Procedures: None ordered  Follow-Up: Your physician wants you to follow-up AS NEEDED   Any Other Special Instructions Will Be Listed Below (If Applicable).  Stay hydrated   If you need a refill on your cardiac medications before your next appointment, please call your pharmacy.      Sumner Boast, PA-C  09/08/2017 2:56 PM    [facility] [address], [place], [place]  [address] Phone: [phone]; Fax: [phone]

## 2017-09-08 NOTE — Patient Instructions (Addendum)
Medication Instructions:  Your physician recommends that you continue on your current medications as directed. Please refer to the Current Medication list given to you today.   Labwork: None ordered  Testing/Procedures: None ordered  Follow-Up: Your physician wants you to follow-up AS NEEDED   Any Other Special Instructions Will Be Listed Below (If Applicable).  Stay hydrated   If you need a refill on your cardiac medications before your next appointment, please call your pharmacy.

## 2017-09-09 DIAGNOSIS — H2511 Age-related nuclear cataract, right eye: Secondary | ICD-10-CM | POA: Diagnosis not present

## 2017-09-09 DIAGNOSIS — H25011 Cortical age-related cataract, right eye: Secondary | ICD-10-CM | POA: Diagnosis not present

## 2017-09-09 DIAGNOSIS — H1859 Other hereditary corneal dystrophies: Secondary | ICD-10-CM | POA: Diagnosis not present

## 2017-09-09 DIAGNOSIS — H353130 Nonexudative age-related macular degeneration, bilateral, stage unspecified: Secondary | ICD-10-CM | POA: Diagnosis not present

## 2017-09-16 DIAGNOSIS — Z23 Encounter for immunization: Secondary | ICD-10-CM | POA: Diagnosis not present

## 2017-09-17 ENCOUNTER — Ambulatory Visit: Payer: Medicare Other | Admitting: Diagnostic Neuroimaging

## 2017-10-15 DIAGNOSIS — R350 Frequency of micturition: Secondary | ICD-10-CM | POA: Diagnosis not present

## 2017-10-27 DIAGNOSIS — N39490 Overflow incontinence: Secondary | ICD-10-CM | POA: Diagnosis not present

## 2017-10-27 DIAGNOSIS — N400 Enlarged prostate without lower urinary tract symptoms: Secondary | ICD-10-CM | POA: Diagnosis not present

## 2018-03-11 DIAGNOSIS — L570 Actinic keratosis: Secondary | ICD-10-CM | POA: Diagnosis not present

## 2018-03-23 ENCOUNTER — Telehealth: Payer: Self-pay | Admitting: Diagnostic Neuroimaging

## 2018-03-23 NOTE — Telephone Encounter (Signed)
Received call back from wife and informed her that Dr Leta Baptist stated they may try it if they wish. However he does not generally recommend those types of supplements. Advise dif she has questions, concerns regarding his other medications, she should take information to his pharmacist to discuss. She verbalized understanding, appreciation.

## 2018-03-23 NOTE — Telephone Encounter (Signed)
They may give it a try if they wish, but I do not generally recommend these types of supplements. -VRP

## 2018-03-23 NOTE — Telephone Encounter (Signed)
LVM requesting call back.

## 2018-03-23 NOTE — Telephone Encounter (Signed)
Pt wife(on DPR)is asking for a call from Dr Doyce Para, wife said she could not state the reason at the moment but she would like a call back

## 2018-03-23 NOTE — Telephone Encounter (Signed)
Called wife, Dominic Garcia who stated she saw ad for prixelin and wants to know Dr Leta Baptist recommendations, if he would prescribe it. This RN advised her per [employer] site it contains vitamins, minerals, organic ingredients. She stated she was aware. She stated he continues to be very forgetful even on Namenda, and he still denies he has a memory problem. This RN advised that is normal for him to deny, advised it is most likely progression of demenitia. She would like call back re: Prixelin. This RN stated will discuss with Dr Leta Baptist and call her back. She verbalized understanding, appreciation.

## 2018-03-24 DIAGNOSIS — H25013 Cortical age-related cataract, bilateral: Secondary | ICD-10-CM | POA: Diagnosis not present

## 2018-03-24 DIAGNOSIS — H353130 Nonexudative age-related macular degeneration, bilateral, stage unspecified: Secondary | ICD-10-CM | POA: Diagnosis not present

## 2018-03-24 DIAGNOSIS — H2511 Age-related nuclear cataract, right eye: Secondary | ICD-10-CM | POA: Diagnosis not present

## 2018-03-24 DIAGNOSIS — H25043 Posterior subcapsular polar age-related cataract, bilateral: Secondary | ICD-10-CM | POA: Diagnosis not present

## 2018-03-24 DIAGNOSIS — H2513 Age-related nuclear cataract, bilateral: Secondary | ICD-10-CM | POA: Diagnosis not present

## 2018-03-26 DIAGNOSIS — Z800 Family history of malignant neoplasm of digestive organs: Secondary | ICD-10-CM | POA: Diagnosis not present

## 2018-03-26 DIAGNOSIS — K635 Polyp of colon: Secondary | ICD-10-CM | POA: Diagnosis not present

## 2018-03-26 DIAGNOSIS — Z86010 Personal history of colonic polyps: Secondary | ICD-10-CM | POA: Diagnosis not present

## 2018-03-26 DIAGNOSIS — Z1211 Encounter for screening for malignant neoplasm of colon: Secondary | ICD-10-CM | POA: Diagnosis not present

## 2018-03-26 DIAGNOSIS — K648 Other hemorrhoids: Secondary | ICD-10-CM | POA: Diagnosis not present

## 2018-03-26 DIAGNOSIS — D122 Benign neoplasm of ascending colon: Secondary | ICD-10-CM | POA: Diagnosis not present

## 2018-03-27 DIAGNOSIS — D122 Benign neoplasm of ascending colon: Secondary | ICD-10-CM | POA: Diagnosis not present

## 2018-03-27 DIAGNOSIS — D126 Benign neoplasm of colon, unspecified: Secondary | ICD-10-CM | POA: Diagnosis not present

## 2018-04-15 DIAGNOSIS — H6122 Impacted cerumen, left ear: Secondary | ICD-10-CM | POA: Diagnosis not present

## 2018-04-15 DIAGNOSIS — H903 Sensorineural hearing loss, bilateral: Secondary | ICD-10-CM | POA: Diagnosis not present

## 2018-04-21 NOTE — Telephone Encounter (Addendum)
Called wife who stated Namenda "is not helping. His memory is getting worse." This RN advised that is most likely due to the progression of his dementia and his aging. The medication will not stop memory loss or reverse it. Advised her that his PCP can refill per Dr Gladstone Lighter last note. She stated they are going to [place] next week to look at Assisted Living/Memory care places. She stated they have family there that can be of help to them. She verbalized understanding, appreciation of call.

## 2018-04-21 NOTE — Telephone Encounter (Signed)
Pts wife requesting a call back to discuss a different medication/ other options instead of memantine (NAMENDA XR) 28 MG CP24 24 hr capsule Please call to advise

## 2018-05-25 DIAGNOSIS — H2511 Age-related nuclear cataract, right eye: Secondary | ICD-10-CM | POA: Diagnosis not present

## 2018-05-26 DIAGNOSIS — H2512 Age-related nuclear cataract, left eye: Secondary | ICD-10-CM | POA: Diagnosis not present

## 2018-05-26 DIAGNOSIS — H25012 Cortical age-related cataract, left eye: Secondary | ICD-10-CM | POA: Diagnosis not present

## 2018-05-26 DIAGNOSIS — Z961 Presence of intraocular lens: Secondary | ICD-10-CM | POA: Diagnosis not present

## 2018-05-26 DIAGNOSIS — H25042 Posterior subcapsular polar age-related cataract, left eye: Secondary | ICD-10-CM | POA: Diagnosis not present

## 2018-06-29 ENCOUNTER — Other Ambulatory Visit: Payer: Self-pay | Admitting: Diagnostic Neuroimaging

## 2018-07-02 ENCOUNTER — Other Ambulatory Visit: Payer: Self-pay | Admitting: Diagnostic Neuroimaging

## 2018-07-02 ENCOUNTER — Telehealth: Payer: Self-pay | Admitting: Diagnostic Neuroimaging

## 2018-07-02 MED ORDER — MEMANTINE HCL ER 28 MG PO CP24: 28.0000 mg | ORAL_CAPSULE | Freq: Every day | ORAL | 0 refills | Status: DC

## 2018-07-02 NOTE — Telephone Encounter (Signed)
Pts wife Lelon Frohlich requesting a call, stating she is unsure why memantine (NAMENDA XR) 28 MG CP24 24 hr capsule has been denied the pt will be out of medication saturday morning

## 2018-07-02 NOTE — Telephone Encounter (Signed)
I spoke to wife.  She will contact Dr. Rex Kras and see if he would be ok to refill medication in the future and if not will see [place] yrly for refills.  I refilled for 90 days at [facility].  Wife verbalized understanding.

## 2018-07-07 NOTE — Telephone Encounter (Signed)
Pt wife(on DPR-[place],Ann @[phone] ) has called back to inform that Dr Rex Kras feels it would be a good idea for pt to continue to just see Dr Leta Baptist  SB:[id number] (NAMENDA XR) 28 MG CP24 24 hr capsule  wife is also wanting to know the best time of day for pt to take medication.  Please call

## 2018-07-07 NOTE — Telephone Encounter (Signed)
Spoke to wife.  Ok to take the memantine xr either day or night.  (be consistent).  She verbalized understanding. Has appt 07-14-18.

## 2018-07-11 DIAGNOSIS — M545 Low back pain: Secondary | ICD-10-CM | POA: Diagnosis not present

## 2018-07-11 DIAGNOSIS — K0510 Chronic gingivitis, plaque induced: Secondary | ICD-10-CM | POA: Diagnosis not present

## 2018-07-11 DIAGNOSIS — M5441 Lumbago with sciatica, right side: Secondary | ICD-10-CM | POA: Diagnosis not present

## 2018-07-11 DIAGNOSIS — J029 Acute pharyngitis, unspecified: Secondary | ICD-10-CM | POA: Diagnosis not present

## 2018-07-11 DIAGNOSIS — M5442 Lumbago with sciatica, left side: Secondary | ICD-10-CM | POA: Diagnosis not present

## 2018-07-12 DIAGNOSIS — J029 Acute pharyngitis, unspecified: Secondary | ICD-10-CM | POA: Diagnosis not present

## 2018-07-12 DIAGNOSIS — R5383 Other fatigue: Secondary | ICD-10-CM | POA: Diagnosis not present

## 2018-07-12 DIAGNOSIS — R251 Tremor, unspecified: Secondary | ICD-10-CM | POA: Diagnosis not present

## 2018-07-12 DIAGNOSIS — Z79899 Other long term (current) drug therapy: Secondary | ICD-10-CM | POA: Diagnosis not present

## 2018-07-14 ENCOUNTER — Ambulatory Visit (INDEPENDENT_AMBULATORY_CARE_PROVIDER_SITE_OTHER): Payer: Medicare Other | Admitting: Diagnostic Neuroimaging

## 2018-07-14 ENCOUNTER — Encounter: Payer: Self-pay | Admitting: Diagnostic Neuroimaging

## 2018-07-14 VITALS — BP 92/49 | HR 44 | Ht 67.0 in | Wt 143.2 lb

## 2018-07-14 DIAGNOSIS — F0390 Unspecified dementia without behavioral disturbance: Secondary | ICD-10-CM | POA: Diagnosis not present

## 2018-07-14 DIAGNOSIS — F03A0 Unspecified dementia, mild, without behavioral disturbance, psychotic disturbance, mood disturbance, and anxiety: Secondary | ICD-10-CM

## 2018-07-14 NOTE — Patient Instructions (Signed)
-   continue memantine  - no driving  - caution with finances
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
## 2018-07-15 DIAGNOSIS — K0510 Chronic gingivitis, plaque induced: Secondary | ICD-10-CM | POA: Diagnosis not present

## 2018-08-13 DIAGNOSIS — Z85820 Personal history of malignant melanoma of skin: Secondary | ICD-10-CM | POA: Diagnosis not present

## 2018-08-13 DIAGNOSIS — E782 Mixed hyperlipidemia: Secondary | ICD-10-CM | POA: Diagnosis not present

## 2018-08-13 DIAGNOSIS — I959 Hypotension, unspecified: Secondary | ICD-10-CM | POA: Diagnosis not present

## 2018-08-13 DIAGNOSIS — R413 Other amnesia: Secondary | ICD-10-CM | POA: Diagnosis not present

## 2018-08-13 DIAGNOSIS — E039 Hypothyroidism, unspecified: Secondary | ICD-10-CM | POA: Diagnosis not present

## 2018-08-13 DIAGNOSIS — Z79899 Other long term (current) drug therapy: Secondary | ICD-10-CM | POA: Diagnosis not present

## 2018-08-13 DIAGNOSIS — H3530 Unspecified macular degeneration: Secondary | ICD-10-CM | POA: Diagnosis not present

## 2018-08-13 DIAGNOSIS — R3989 Other symptoms and signs involving the genitourinary system: Secondary | ICD-10-CM | POA: Diagnosis not present

## 2018-08-14 DIAGNOSIS — E039 Hypothyroidism, unspecified: Secondary | ICD-10-CM | POA: Diagnosis not present

## 2018-08-14 DIAGNOSIS — Z79899 Other long term (current) drug therapy: Secondary | ICD-10-CM | POA: Diagnosis not present

## 2018-08-14 DIAGNOSIS — E782 Mixed hyperlipidemia: Secondary | ICD-10-CM | POA: Diagnosis not present

## 2018-08-18 ENCOUNTER — Encounter: Payer: Self-pay | Admitting: Cardiology

## 2018-08-18 ENCOUNTER — Encounter (INDEPENDENT_AMBULATORY_CARE_PROVIDER_SITE_OTHER): Payer: Self-pay

## 2018-08-18 ENCOUNTER — Ambulatory Visit (INDEPENDENT_AMBULATORY_CARE_PROVIDER_SITE_OTHER): Payer: Medicare Other | Admitting: Cardiology

## 2018-08-18 VITALS — BP 100/62 | HR 74 | Ht 67.0 in | Wt 146.1 lb

## 2018-08-18 DIAGNOSIS — I951 Orthostatic hypotension: Secondary | ICD-10-CM | POA: Diagnosis not present

## 2018-08-18 NOTE — Progress Notes (Signed)
08/18/2018 Dominic Garcia   09/11/1926  [id number]  Primary Physician Hulan Fess, MD Primary Cardiologist: Dr. Radford Pax   Reason for Visit/CC: soft BP  HPI:  Dominic Garcia is a 84 y.o. male who is being seen today for the evaluation of low BP.   He was seen by Dr. Radford Pax 03/04/17 given history of excessive daytime sleepiness felt secondary to advancing age, poor sleep due to frequent trips to the bathroom at night and thyroid issues. She ordered a home sleep study but this was never done. Pt has declined to have have sleep study. 2-D echo in 2014 showed normal LVEF at 65-70% with grade 1 DD and mild MR.  He was seen again by Estella Husk, PA,  for hypotension 09/08/17. He was referred by his PCP. His blood pressure was 106/64 lying 90/64 sitting and 90/60 standing at PCP office. When we evaluated him his BP was more stable in the low 989Q systolic and he was completely asymptomatic. Per records, Chest X ray was norma.l EKG was normal CBC, chemistries and thyroid function were all normal. It was felt that his issues with hypotension around that time was perhaps due to dehydration. Given his lack of symptoms, it was advised that he simply try to stay well hydrated and to wear compression stockings to prevent symptomatic orthostatic hypotension. He was advised to f/u with cardiology as needed.   He is here again today with his wife given concerns over low blood pressure.  His wife monitors his blood pressure regularly and he often has systolic blood pressures in the 90s however the patient reports that he has been completely asymptomatic.  He denies any symptoms of dizziness or lightheadedness.  No fatigue or chest pain.  No syncope or near syncope.  His blood pressure in clinic today is 100/62.  His wife thinks that he does not drink enough fluids.  The patient would agree with this. He has a good appetite and eats 3 meals a day.  He does not wear compression stockings.  His medications were  reviewed.  The only medication he is on that can affect blood pressure is tamsulosin, which the patient and his wife both state that he needs to be on due to his underlying prostate issues.   Current Meds  Medication Sig  . aspirin EC 81 MG tablet Take 81 mg by mouth 3 (three) times a week.  . Calcium Citrate (CITRACAL PO) Take 1 tablet by mouth daily.   . cyanocobalamin 1000 MCG tablet Take 1,000 mcg by mouth daily. 5000 units daily  . levothyroxine (SYNTHROID, LEVOTHROID) 88 MCG tablet Take 88 mcg by mouth daily.  . memantine (NAMENDA XR) 28 MG CP24 24 hr capsule Take 1 capsule (28 mg total) by mouth daily.  . Multiple Vitamins-Minerals (ICAPS PO) Take 1 capsule by mouth every morning.   . tamsulosin (FLOMAX) 0.4 MG CAPS capsule Take 0.4 mg by mouth daily.   Allergies  Allergen Reactions  . Donepezil Other (See Comments)    Made pt wobbly  . Dextromethorphan-Guaifenesin Rash  . Guaifenesin Rash and Swelling  . Mucinex [Guaifenesin Er] Swelling and Rash   Past Medical History:  Diagnosis Date  . Allergic rhinitis   . Allergic rhinoconjunctivitis   . Colon polyp 2003  . H/O peptic ulcer    at age 79  . Macular degeneration   . Macular degeneration, dry   . Melanoma ([place]) 2000   treated at [facility]  . Melanoma ([place])  treated at [facility]  . Memory loss   . Mixed dyslipidemia   . Prostate hypertrophy   . Reflux   . Squamous cell carcinoma    on pts scalp- local derm Dr Denna Haggard  . Thyroid disease    hypothyroid  . Thyroid disease    Family History  Problem Relation Age of Onset  . Cancer Mother   . Kidney disease Father   . Macular degeneration Brother   . Alzheimer's disease Brother   . Renal Disease Father   . Aneurysm Father   . Stroke Father   . Colon cancer Mother   . Kidney Stones Brother    Past Surgical History:  Procedure Laterality Date  . APPENDECTOMY  1937  . APPENDECTOMY    . CHOLECYSTECTOMY    . INGUINAL HERNIA REPAIR Left 2012   at [place]  . KNEE  ARTHROSCOPY Left 2000   Dr Telford Nab  . MELANOMA EXCISION     arm  . MELANOMA EXCISION    . TONSILLECTOMY  1935  . TONSILLECTOMY AND ADENOIDECTOMY     Social History   Socioeconomic History  . Marital status: Married    Spouse name: Real Cons  . Number of children: 3  . Years of education: 14  . Highest education level: Not on file  Occupational History  . Occupation: other    Comment: [employer]  Social Needs  . Financial resource strain: Not on file  . Food insecurity:    Worry: Not on file    Inability: Not on file  . Transportation needs:    Medical: Not on file    Non-medical: Not on file  Tobacco Use  . Smoking status: Former Smoker    Packs/day: 2.00    Years: 10.00    Pack years: 20.00    Types: Cigarettes  . Smokeless tobacco: Never Used  Substance and Sexual Activity  . Alcohol use: No    Comment: Wine, occasionally  . Drug use: No  . Sexual activity: Not on file  Lifestyle  . Physical activity:    Days per week: Not on file    Minutes per session: Not on file  . Stress: Not on file  Relationships  . Social connections:    Talks on phone: Not on file    Gets together: Not on file    Attends religious service: Not on file    Active member of club or organization: Not on file    Attends meetings of clubs or organizations: Not on file    Relationship status: Not on file  . Intimate partner violence:    Fear of current or ex partner: Not on file    Emotionally abused: Not on file    Physically abused: Not on file    Forced sexual activity: Not on file  Other Topics Concern  . Not on file  Social History Narrative   ** Merged History Encounter **       Pt lives at home with his spouse. Caffeine Use- Does not consume.     Review of Systems: General: negative for chills, fever, night sweats or weight changes.  Cardiovascular: negative for chest pain, dyspnea on exertion, edema, orthopnea, palpitations, paroxysmal nocturnal  dyspnea or shortness of breath Dermatological: negative for rash Respiratory: negative for cough or wheezing Urologic: negative for hematuria Abdominal: negative for nausea, vomiting, diarrhea, bright red blood per rectum, melena, or hematemesis Neurologic: negative for visual changes, syncope, or dizziness All other systems reviewed  and are otherwise negative except as noted above.   Physical Exam:  Blood pressure 100/62, pulse 74, height 5\' 7"  (1.702 m), weight 146 lb 1.9 oz (66.3 kg).  General appearance: alert, cooperative and no distress Neck: no carotid bruit and no JVD Lungs: clear to auscultation bilaterally Heart: regular rate and rhythm, S1, S2 normal, no murmur, click, rub or gallop Extremities: extremities normal, atraumatic, no cyanosis or edema Pulses: 2+ and symmetric Skin: Skin color, texture, turgor normal. No rashes or lesions Neurologic: Grossly normal  EKG not performed-- personally reviewed   ASSESSMENT AND PLAN:   1.  Hypotension: baseline systolic blood pressures typically run in the 90s to low 100s.  Patient is completely asymptomatic and denies any symptoms of feeling dizzy, lightheadedness, syncope/near syncope.  No dyspnea or fatigue. His wife reports poor fluid intake. Medications are reviewed.  He is on tamsulosin which the patient and his wife both note that he needs to remain on given his underlying prostate issues.  He is on no other antihypertensive medications.  I recommended that the patient increase his fluid intake and can also slightly increase salt intake.  I also recommended use of compression stockings to help prevent development of orthostatic hypotension and reduce risk of falls.  Recommended that he monitor his weight closely at home if he is to increase his fluid and salt intake.  Patient and wife verbalized understanding.  They will try to increase fluid and salt intake.  They will notify our office if worsening condition  or if he develops any  symptoms.  He will otherwise follow-up as needed w/ cardiology. Continue regular f/u with PCP.    Malyn Aytes Ladoris Gene, MHS [facility] 08/18/2018 2:07 PM

## 2018-08-18 NOTE — Patient Instructions (Addendum)
Medication Instructions:   Your physician recommends that you continue on your current medications as directed. Please refer to the Current Medication list given to you today.   If you need a refill on your cardiac medications before your next appointment, please call your pharmacy.  Labwork: NONE ORDERED  TODAY    Testing/Procedures: NONE ORDERED  TODAY    Follow-Up:  CONTACT [facility] 336 [phone] AS NEEDED FOR  ANY CARDIAC RELATED SYMPTOMS    Any Other Special Instructions Will Be Listed Below (If Applicable).   YOU HAVE BEEN RECCOMMENDED TO  SLIGHTLY INCREASE SALT INTAKE    AND DRINK MORE FLUIDS    CALLING CLINIC IF TOP BLOOD PRESSURE  NUMBER DOESN'T STAY ABOVE 90  MONITOR YOUR WIGHT  WEEKLY                                                                                                                                                 '

## 2018-08-19 DIAGNOSIS — Z79899 Other long term (current) drug therapy: Secondary | ICD-10-CM | POA: Diagnosis not present

## 2018-08-21 ENCOUNTER — Encounter

## 2018-08-25 ENCOUNTER — Ambulatory Visit: Payer: Medicare Other | Admitting: Cardiology

## 2018-08-28 DIAGNOSIS — I959 Hypotension, unspecified: Secondary | ICD-10-CM | POA: Diagnosis not present

## 2018-09-14 DIAGNOSIS — H2512 Age-related nuclear cataract, left eye: Secondary | ICD-10-CM | POA: Diagnosis not present

## 2018-09-28 ENCOUNTER — Other Ambulatory Visit: Payer: Self-pay | Admitting: Dermatology

## 2018-09-28 DIAGNOSIS — Z23 Encounter for immunization: Secondary | ICD-10-CM | POA: Diagnosis not present

## 2018-09-28 DIAGNOSIS — D099 Carcinoma in situ, unspecified: Secondary | ICD-10-CM

## 2018-09-28 DIAGNOSIS — L299 Pruritus, unspecified: Secondary | ICD-10-CM | POA: Diagnosis not present

## 2018-09-28 DIAGNOSIS — D044 Carcinoma in situ of skin of scalp and neck: Secondary | ICD-10-CM | POA: Diagnosis not present

## 2018-09-28 HISTORY — DX: Carcinoma in situ, unspecified: D09.9

## 2018-10-01 ENCOUNTER — Other Ambulatory Visit: Payer: Self-pay | Admitting: Diagnostic Neuroimaging

## 2018-10-02 ENCOUNTER — Other Ambulatory Visit: Payer: Self-pay | Admitting: Diagnostic Neuroimaging

## 2018-10-05 NOTE — Telephone Encounter (Signed)
Patient released to PCP July 2019. Namenda Refilled x 3 months with note to pharmacy re: future refills through PCP.

## 2018-11-04 NOTE — Progress Notes (Signed)
Name: Dominic Garcia  MRN/ DOB: [id number], 01/04/1926    Age/ Sex: 84 y.o., male    PCP: Hulan Fess, MD   Reason for Endocrinology Evaluation: Low BP     Date of Initial Endocrinology Evaluation: 11/05/2018     HPI: [name] is a 84 y.o. male with a past medical history of BPH and low BP. The patient presented for initial endocrinology clinic visit on 11/05/2018 for consultative assistance with his low BP .   He was noted to have low BP in 2018. He has seen cardiology and was though to be related to dehydration vs autonomic dysfunction. He was advised to hydrate and wear compression stockings.  Patient denies any symptoms of dizziness, near syncope or weakness.   He denies any weight loss, nausea, diarrhea. Patient denies any hyperpigmentation of the skin. He has a history  of stage III melanoma, in 2001.  Status post excision. Patient denies any use of corticosteroids in the past few months. Patient denies using any over-the-counter medications.   I have reviewed PMH, PSH, SH and FH    HISTORY:  Past Medical History:  Past Medical History:  Diagnosis Date  . Allergic rhinitis   . Allergic rhinoconjunctivitis   . Colon polyp 2003  . H/O peptic ulcer    at age 45  . Macular degeneration   . Macular degeneration, dry   . Melanoma ([place]) 2000   treated at [facility]  . Melanoma ([place])    [facility] at [facility]  . Memory loss   . Mixed dyslipidemia   . Prostate hypertrophy   . Reflux   . Squamous cell carcinoma    on pts scalp- local derm Dr Denna Haggard  . Thyroid disease    hypothyroid  . Thyroid disease    Past Surgical History:  Past Surgical History:  Procedure Laterality Date  . APPENDECTOMY  1937  . APPENDECTOMY    . CHOLECYSTECTOMY    . INGUINAL HERNIA REPAIR Left 2012   at [place]  . KNEE ARTHROSCOPY Left 2000   Dr Telford Nab  . MELANOMA EXCISION     arm  . MELANOMA EXCISION    . TONSILLECTOMY  1935  . TONSILLECTOMY AND ADENOIDECTOMY         Social History:  reports that he has quit smoking. His smoking use included cigarettes. He has a 20.00 pack-year smoking history. He has never used smokeless tobacco. He reports that he does not drink alcohol or use drugs.  Family History: family history includes Alzheimer's disease in his brother; Aneurysm in his father; Cancer in his mother; Colon cancer in his mother; Kidney Stones in his brother; Kidney disease in his father; Macular degeneration in his brother; Renal Disease in his father; Stroke in his father.   HOME MEDICATIONS: Current Outpatient Medications on File Prior to Visit  Medication Sig Dispense Refill  . aspirin EC 81 MG tablet Take 81 mg by mouth 3 (three) times a week.    . Calcium Citrate (CITRACAL PO) Take 1 tablet by mouth daily.     . cyanocobalamin 1000 MCG tablet Take 1,000 mcg by mouth daily. 5000 units daily    . fludrocortisone (FLORINEF) 0.1 MG tablet Take 100 mcg by mouth daily.  12  . levothyroxine (SYNTHROID, LEVOTHROID) 88 MCG tablet Take 88 mcg by mouth daily.    . memantine (NAMENDA XR) 28 MG CP24 24 hr capsule TAKE 1 CAPSULE BY MOUTH DAILY 90 capsule 0  .  Multiple Vitamins-Minerals (ICAPS PO) Take 1 capsule by mouth every morning.     . tamsulosin (FLOMAX) 0.4 MG CAPS capsule Take 0.4 mg by mouth daily.     No current facility-administered medications on file prior to visit.       REVIEW OF SYSTEMS: A comprehensive ROS was conducted with the patient and is negative except as per HPI and below:  Review of Systems  Constitutional: Positive for malaise/fatigue. Negative for weight loss.  HENT: Negative for congestion and sore throat.   Eyes: Negative for blurred vision and pain.  Respiratory: Negative for cough and shortness of breath.   Cardiovascular: Negative for chest pain and palpitations.  Gastrointestinal: Negative for diarrhea and nausea.  Genitourinary: Positive for frequency.  Skin: Negative for itching and rash.  Endo/Heme/Allergies:  Negative for polydipsia.       OBJECTIVE:  VS: BP (!) 100/58   Pulse 68   Resp 14   Ht 5\' 2"  (1.575 m)   Wt 147 lb (66.7 kg)   SpO2 98%   BMI 26.89 kg/m    Wt Readings from Last 3 Encounters:  11/05/18 147 lb (66.7 kg)  08/18/18 146 lb 1.9 oz (66.3 kg)  07/14/18 143 lb 3.2 oz (65 kg)     EXAM: General: Pt appears well and is in NAD  Hydration: Well-hydrated with moist mucous membranes and good skin turgor  Eyes: External eye exam normal without stare, lid lag or exophthalmos.  EOM intact.  PERRL.  Ears, Nose, Throat: Hearing: Grossly intact bilaterally Dental: Good dentition  Throat: Clear without mass, erythema or exudate  Neck: General: Supple without adenopathy. Thyroid: Thyroid size normal.  No goiter or nodules appreciated. No thyroid bruit.  Lungs: Clear with good BS bilat with no rales, rhonchi, or wheezes  Heart: Auscultation: RRR.  Abdomen: Normoactive bowel sounds, soft, nontender, without masses or organomegaly palpable  Extremities: BL LE: No pretibial edema normal ROM and strength.  Skin: Hair: Texture and amount normal with gender appropriate distribution Skin Inspection: No rashes, acanthosis nigricans/skin tags. Skin Palpation: Skin temperature, texture, and thickness normal to palpation  Neuro: Cranial nerves: II - XII grossly intact  Motor: Normal strength throughout DTRs: 2+ and symmetric in UE without delay in relaxation phase  Mental Status: Judgment, insight: Intact Orientation: Oriented to time, place, and person Mood and affect: No depression, anxiety, or agitation     DATA REVIEWED: Old records , labs and images have been reviewed.  08/14/18  Hg   13.4 g/dL PLT 233 K/uL   Glucose 91 mg/dL  BUn/Cr 22/1.05 mg/dL  K 4.5 mmol/L  Ca 9.2 mg/dL  TSH 2.81 uIU/mL   Results for ALVIS, EDGELL (MRN [id number]) as of 11/06/2018 07:47  Ref. Range 11/05/2018 09:54 11/05/2018 10:29 11/05/2018 10:56  Cortisol, Plasma Latest Units: ug/dL 9.8  19.7 22.6     ASSESSMENT/PLAN/RECOMMENDATIONS:   1. Low Blood pressure:  Patient is asymptomatic during periods of low blood pressure. Patient has not had any systolic blood pressure lower than 90 mmHg. On exam today he was noted to have a slight decrease in blood pressure from sitting to standing position, asymptomatic. I have very low clinical suspicion for adrenal insufficiency. We will proceed with cosyntropin stimulation test. Patient understands that tamsulosin could cause orthostasis, patient advised to continue taking this at bedtime, as he has severe BPH symptoms without tamsulosin. Discussed other differential diagnosis of dehydration versus autonomic dysfunction vs anemia (anemia ruled out on august, 2019 testing) Continue treatment per  cardiology recommendations.  Addendum: Cosyntropin stimulation test is normal . No evidence of adrenal insufficieny. A letter has been mailed to the patient.     F/u PRN         Signed electronically by: Mack Guise, MD  [facility]  [facility] [address] [place], [place] [address] Phone: [phone] FAX: [phone]   CC: Hulan Fess, [place] [address] Phone: [phone] Fax: [phone]   Return to Endocrinology clinic as below: No future appointments.

## 2018-11-05 ENCOUNTER — Telehealth: Payer: Self-pay | Admitting: Internal Medicine

## 2018-11-05 ENCOUNTER — Ambulatory Visit (INDEPENDENT_AMBULATORY_CARE_PROVIDER_SITE_OTHER): Payer: Medicare Other | Admitting: Internal Medicine

## 2018-11-05 ENCOUNTER — Encounter: Payer: Self-pay | Admitting: Internal Medicine

## 2018-11-05 ENCOUNTER — Other Ambulatory Visit: Payer: Self-pay

## 2018-11-05 VITALS — BP 90/50 | HR 68 | Resp 14 | Ht 62.0 in | Wt 147.0 lb

## 2018-11-05 DIAGNOSIS — I950 Idiopathic hypotension: Secondary | ICD-10-CM | POA: Diagnosis not present

## 2018-11-05 LAB — CORTISOL
Cortisol, Plasma: 19.7 ug/dL
Cortisol, Plasma: 9.8 ug/dL
Cortisol, Plasma: 22.6 ug/dL

## 2018-11-05 MED ORDER — COSYNTROPIN NICU IV SYRINGE 0.25 MG/ML (STANDARD DOSE)
0.2500 mg | Freq: Once | INTRAVENOUS | Status: AC
  Administered 2018-11-05: 0.25 mg via INTRAVENOUS

## 2018-11-05 NOTE — Telephone Encounter (Signed)
Please call patient or Lelon Frohlich (wife) re: test results at ph# [phone]. If no answer please call ph# [phone]

## 2018-11-05 NOTE — Patient Instructions (Signed)
-   Your doctor sent you here to check your adrenal gland.  - We will perform a 3 step blood work today and will contact you with the results

## 2018-11-06 ENCOUNTER — Encounter: Payer: Self-pay | Admitting: Internal Medicine

## 2018-11-06 NOTE — Telephone Encounter (Signed)
2nd attempt "Please call patient or Dominic Garcia (wife) re: test results at ph# [phone]. If no answer please call ph# [phone]" Charisse March is currently out of the office this is why it is being routed to the doctor.

## 2018-11-26 ENCOUNTER — Ambulatory Visit: Payer: Medicare Other | Admitting: Allergy

## 2018-11-27 ENCOUNTER — Telehealth: Payer: Self-pay | Admitting: *Deleted

## 2018-11-27 NOTE — Telephone Encounter (Signed)
Spoke with wife and advised her that the patient couldn't tolerate donepezil, another memory medication.  She asked about a patch she'd read about, and if it was better than the namenda.  This RN advised it is the Exelon patch, but unsure if it is "better" than namenda or another form of same. Advised her it is typically prescribed if the patient cannot tolerate namenda, will not swallow or cannot swallow tabs. She stated their insurance changes at first of year, and if Dr Leta Baptist thinks it is better than namenda, she will call insurance to see if it is covered. This RN did discuss that memory loss and dementia is progressive and any medications do not stop it or reverse it. This RN advised will call her back later today to let her know Dr Gladstone Lighter recommendations, reply. She verbalized understanding, appreciation.

## 2018-11-27 NOTE — Telephone Encounter (Signed)
May consider exelon patch to add on. I had returned patient to PCP. PCP may rx this med. -VRP

## 2018-11-27 NOTE — Telephone Encounter (Signed)
Ann, patients wife, is requesting a call back.  Wants to see if there is anything else patient could take for memory.  Currently taking Namenda.

## 2018-11-27 NOTE — Telephone Encounter (Signed)
Spoke with wife and gave her Dr Ross Stores. She asked what to do if the PCP refers back to Dr Leta Baptist. This RN stated reviewed Dr Gladstone Lighter discussion from last office note, and advised her that at t his pint his PCP can mange medications. If there is a drastic sudden change he may refer back. The wife stated "he is worse". This RN again advised it is a progressive condition and perhaps adding the patch may be helpful. Suggested she call insurance and check on coverage. Then she can decide if she wants to discuss adding patch with PCP. She verbalized understanding, appreciation.

## 2018-12-22 ENCOUNTER — Other Ambulatory Visit: Payer: Self-pay | Admitting: Dermatology

## 2018-12-22 DIAGNOSIS — C4491 Basal cell carcinoma of skin, unspecified: Secondary | ICD-10-CM

## 2018-12-22 DIAGNOSIS — C44519 Basal cell carcinoma of skin of other part of trunk: Secondary | ICD-10-CM | POA: Diagnosis not present

## 2018-12-22 HISTORY — DX: Basal cell carcinoma of skin, unspecified: C44.91

## 2019-01-02 ENCOUNTER — Encounter (HOSPITAL_COMMUNITY): Payer: Self-pay

## 2019-01-02 ENCOUNTER — Emergency Department (HOSPITAL_COMMUNITY): Payer: Medicare Other

## 2019-01-02 ENCOUNTER — Other Ambulatory Visit: Payer: Self-pay

## 2019-01-02 ENCOUNTER — Observation Stay (HOSPITAL_COMMUNITY)
Admission: EM | Admit: 2019-01-02 | Discharge: 2019-01-03 | Disposition: A | Discharge status: Home / Self Care | Payer: Medicare Other | Attending: Internal Medicine | Admitting: Internal Medicine

## 2019-01-02 DIAGNOSIS — Z85820 Personal history of malignant melanoma of skin: Secondary | ICD-10-CM | POA: Diagnosis not present

## 2019-01-02 DIAGNOSIS — E039 Hypothyroidism, unspecified: Secondary | ICD-10-CM | POA: Diagnosis present

## 2019-01-02 DIAGNOSIS — Z79890 Hormone replacement therapy (postmenopausal): Secondary | ICD-10-CM | POA: Diagnosis not present

## 2019-01-02 DIAGNOSIS — R05 Cough: Secondary | ICD-10-CM | POA: Diagnosis present

## 2019-01-02 DIAGNOSIS — J984 Other disorders of lung: Secondary | ICD-10-CM | POA: Diagnosis not present

## 2019-01-02 DIAGNOSIS — N400 Enlarged prostate without lower urinary tract symptoms: Secondary | ICD-10-CM | POA: Diagnosis not present

## 2019-01-02 DIAGNOSIS — X58XXXA Exposure to other specified factors, initial encounter: Secondary | ICD-10-CM | POA: Diagnosis not present

## 2019-01-02 DIAGNOSIS — T17900A Unspecified foreign body in respiratory tract, part unspecified causing asphyxiation, initial encounter: Secondary | ICD-10-CM | POA: Diagnosis not present

## 2019-01-02 DIAGNOSIS — E782 Mixed hyperlipidemia: Secondary | ICD-10-CM | POA: Insufficient documentation

## 2019-01-02 DIAGNOSIS — J690 Pneumonitis due to inhalation of food and vomit: Secondary | ICD-10-CM | POA: Diagnosis not present

## 2019-01-02 DIAGNOSIS — I959 Hypotension, unspecified: Secondary | ICD-10-CM | POA: Diagnosis present

## 2019-01-02 DIAGNOSIS — Z87891 Personal history of nicotine dependence: Secondary | ICD-10-CM | POA: Diagnosis not present

## 2019-01-02 DIAGNOSIS — T17908A Unspecified foreign body in respiratory tract, part unspecified causing other injury, initial encounter: Secondary | ICD-10-CM | POA: Diagnosis present

## 2019-01-02 DIAGNOSIS — J181 Lobar pneumonia, unspecified organism: Secondary | ICD-10-CM | POA: Diagnosis not present

## 2019-01-02 DIAGNOSIS — R413 Other amnesia: Secondary | ICD-10-CM | POA: Diagnosis not present

## 2019-01-02 DIAGNOSIS — R079 Chest pain, unspecified: Secondary | ICD-10-CM | POA: Diagnosis not present

## 2019-01-02 DIAGNOSIS — D72829 Elevated white blood cell count, unspecified: Secondary | ICD-10-CM | POA: Diagnosis present

## 2019-01-02 DIAGNOSIS — Z7982 Long term (current) use of aspirin: Secondary | ICD-10-CM | POA: Diagnosis not present

## 2019-01-02 NOTE — ED Notes (Signed)
Pt feels as though he has aspirated a pill. Pt reports this has happened in the past and they had to remove the object.

## 2019-01-02 NOTE — ED Notes (Signed)
Patient transported to X-ray 

## 2019-01-02 NOTE — ED Provider Notes (Signed)
[facility] EMERGENCY DEPARTMENT Provider Note   CSN: [id number] Arrival date & time: 01/02/19  2206     History   Chief Complaint Chief Complaint  Patient presents with  . Aspiration    HPI Dominic Garcia is a 85 y.o. male.  The history is provided by the patient, the spouse and medical records. No language interpreter was used.  Shortness of Breath  Severity:  Moderate Onset quality:  Sudden Duration:  1 hour Timing:  Constant Progression:  Waxing and waning Chronicity:  New Context comment:  Aspirated pill Relieved by:  Nothing Worsened by:  Coughing Ineffective treatments:  None tried Associated symptoms: chest pain and cough (just after pill, not before)   Associated symptoms: no abdominal pain, no diaphoresis, no fever, no headaches and no neck pain     Past Medical History:  Diagnosis Date  . Allergic rhinitis   . Allergic rhinoconjunctivitis   . Colon polyp 2003  . H/O peptic ulcer    at age 53  . Macular degeneration   . Macular degeneration, dry   . Melanoma ([place]) 2000   treated at [facility]  . Melanoma ([place])    treated at [facility]  . Memory loss   . Mixed dyslipidemia   . Prostate hypertrophy   . Reflux   . Squamous cell carcinoma    on pts scalp- local derm Dr Denna Haggard  . Thyroid disease    hypothyroid  . Thyroid disease     Patient Active Problem List   Diagnosis Date Noted  . Hypotension 03/04/2017  . Fatigue 03/04/2017  . Excessive daytime sleepiness 03/04/2017  . Syncope and collapse 02/27/2013  . Fall 02/27/2013  . Fracture of great toe, right, closed 02/27/2013  . Sore throat 02/27/2013  . Seasonal allergies 02/27/2013  . Hypothyroidism 02/27/2013  . Right ankle sprain 02/27/2013    Past Surgical History:  Procedure Laterality Date  . APPENDECTOMY  1937  . APPENDECTOMY    . CHOLECYSTECTOMY    . INGUINAL HERNIA REPAIR Left 2012   at [place]  . KNEE ARTHROSCOPY Left 2000   Dr Telford Nab  . MELANOMA EXCISION     arm  . MELANOMA EXCISION    . TONSILLECTOMY  1935  . TONSILLECTOMY AND ADENOIDECTOMY          Home Medications    Prior to Admission medications   Medication Sig Start Date End Date Taking? Authorizing Provider  aspirin EC 81 MG tablet Take 81 mg by mouth 3 (three) times a week.    [provider]  Calcium Citrate (CITRACAL PO) Take 1 tablet by mouth daily.     [provider]  cyanocobalamin 1000 MCG tablet Take 1,000 mcg by mouth daily. 5000 units daily    [provider]  fludrocortisone (FLORINEF) 0.1 MG tablet Take 100 mcg by mouth daily. 10/20/18   [provider]  levothyroxine (SYNTHROID, LEVOTHROID) 88 MCG tablet Take 88 mcg by mouth daily.    [provider]  memantine (NAMENDA XR) 28 MG CP24 24 hr capsule TAKE 1 CAPSULE BY MOUTH DAILY 10/05/18   Penumalli, Earlean Polka, MD  Multiple Vitamins-Minerals (ICAPS PO) Take 1 capsule by mouth every morning.     [provider]  tamsulosin (FLOMAX) 0.4 MG CAPS capsule Take 0.4 mg by mouth daily.    [provider]    Family History Family History  Problem Relation Age of Onset  . Cancer Mother   . Kidney disease  Father   . Macular degeneration Brother   . Alzheimer's disease Brother   . Renal Disease Father   . Aneurysm Father   . Stroke Father   . Colon cancer Mother   . Kidney Stones Brother     Social History Social History   Tobacco Use  . Smoking status: Former Smoker    Packs/day: 2.00    Years: 10.00    Pack years: 20.00    Types: Cigarettes  . Smokeless tobacco: Never Used  Substance Use Topics  . Alcohol use: No    Comment: Wine, occasionally  . Drug use: No     Allergies   Donepezil; Dextromethorphan-guaifenesin; Guaifenesin; and Mucinex [guaifenesin er]   Review of Systems Review of Systems  Constitutional: Negative for chills, diaphoresis, fatigue and fever.  HENT: Negative for congestion.   Eyes: Negative for visual disturbance.    Respiratory: Positive for cough (just after pill, not before), choking and shortness of breath. Negative for chest tightness.   Cardiovascular: Positive for chest pain.  Gastrointestinal: Negative for abdominal pain.  Genitourinary: Negative for dysuria.  Musculoskeletal: Negative for back pain, neck pain and neck stiffness.  Neurological: Negative for dizziness, weakness, light-headedness and headaches.  All other systems reviewed and are negative.    Physical Exam Updated Vital Signs BP (!) 142/68 Comment: Simultaneous filing. User may not have seen previous data.  Pulse 74 Comment: Simultaneous filing. User may not have seen previous data.  Temp 98.6 F (37 C) (Oral)   Resp 17 Comment: Simultaneous filing. User may not have seen previous data.  SpO2 100% Comment: Simultaneous filing. User may not have seen previous data.  Physical Exam Vitals signs and nursing note reviewed.  Constitutional:      General: He is not in acute distress.    Appearance: He is well-developed. He is not ill-appearing, toxic-appearing or diaphoretic.  HENT:     Head: Normocephalic and atraumatic.     Nose: No congestion or rhinorrhea.     Mouth/Throat:     Pharynx: No oropharyngeal exudate.  Eyes:     Conjunctiva/sclera: Conjunctivae normal.  Neck:     Musculoskeletal: Neck supple.  Cardiovascular:     Rate and Rhythm: Normal rate and regular rhythm.     Heart sounds: No murmur.  Pulmonary:     Effort: Tachypnea present. No respiratory distress.     Breath sounds: Examination of the right-lower field reveals rhonchi. Rhonchi present. No wheezing or rales.  Chest:     Chest wall: No tenderness.  Abdominal:     General: Abdomen is flat.     Palpations: Abdomen is soft.     Tenderness: There is no abdominal tenderness.  Skin:    General: Skin is warm and dry.     Capillary Refill: Capillary refill takes less than 2 seconds.  Neurological:     Mental Status: He is alert.      ED  Treatments / Results  Labs (all labs ordered are listed, but only abnormal results are displayed) Labs Reviewed - No data to display  EKG EKG Interpretation  Date/Time:  Saturday January 02 2019 22:14:36 EST Ventricular Rate:  72 PR Interval:    QRS Duration: 99 QT Interval:  393 QTC Calculation: 431 R Axis:   85 Text Interpretation:  Sinus arrhythmia Borderline right axis deviation When compared to prior, no signifiant changes seen.  No STEMI Confirmed by Antony Blackbird [phone]) on 01/02/2019 10:30:38 PM   Radiology Dg Chest 2 View  Result Date: 01/02/2019 CLINICAL DATA:  85 year old who may have aspirated a tablet earlier tonight. EXAM: CHEST - 2 VIEW COMPARISON:  08/22/2017 and earlier. FINDINGS: AP ERECT and LATERAL images were obtained. Suboptimal inspiration. Cardiac silhouette normal in size, unchanged. Thoracic aorta minimally atherosclerotic, unchanged. Hilar and mediastinal contours otherwise unremarkable. New patchy airspace opacities medially in the RIGHT LOWER LOBE. Lungs otherwise clear. Normal pulmonary vascularity. Normal bronchovascular markings. No pleural effusions. Mild degenerative changes involving the thoracic spine. IMPRESSION: Acute pneumonia involving the RIGHT LOWER LOBE, query aspiration. Electronically Signed   By: Evangeline Dakin M.D.   On: 01/02/2019 23:26    Procedures Procedures (including critical care time)  Medications Ordered in ED Medications - No data to display   Initial Impression / Assessment and Plan / ED Course  I have reviewed the triage vital signs and the nursing notes.  Pertinent labs & imaging results that were available during my care of the patient were reviewed by me and considered in my medical decision making (see chart for details).     Chan Sheahan is a 85 y.o. male with a past medical history significant for prior melanoma, dyslipidemia, thyroid disease, prostate hypertrophy, and prior pill aspiration who presents  with concern for aspiration.  Patient reports that he was taking a large coated tablet of "Lurein and Zeathanthin" and felt it aspirated.  He reports this happened 10 years ago when he had to have a bronchoscopy to remove it.  He reports this feels the exact same.  He reports that he is having right-sided chest discomfort, coughing, and some shortness of breath.  The symptoms are on the right side.  Patient denies any preceding symptoms such as fevers, chills, congestion, or cough before the aspiration episode.  He denies any other complaints including no nausea, vomiting, urinary symptoms or GI symptoms.  On exam, patient has coarse breath sounds in the right lower lobe of the lung, normal breath sounds on the left.  Chest and back are nontender.  Oropharyngeal exam unremarkable.  No stridor heard in the throat.  Clinically I am concerned patient aspirated as he did last time.  X-ray was obtained showing evidence of aspiration versus pneumonia.  Clinically I have low suspicion patient has an infectious pneumonia at this time.  We will speak with pulmonary/critical care to determine disposition however anticipate patient will require admission for bronchoscopy to remove the dowel as he is having symptoms from this.   12:09 AM Pulmonary requested a CT scan without contrast of the chest to further determine where this possible pill is lodged and they will come see the patient.  Anticipate following up on their recommendations.   Final Clinical Impressions(s) / ED Diagnoses   Final diagnoses:  Aspiration into airway, initial encounter    Clinical Impression: 1. Aspiration into airway, initial encounter     Disposition: Awaiting recommendations by pulmonary service for further management of pill aspiration.  This note was prepared with assistance of Systems analyst. Occasional wrong-word or sound-a-like substitutions may have occurred due to the inherent limitations of voice  recognition software.      Rilya Longo, Gwenyth Allegra, MD 01/03/19 [phone]

## 2019-01-02 NOTE — ED Notes (Signed)
Pt returned from X-ray.  

## 2019-01-02 NOTE — ED Triage Notes (Signed)
Pt here from home after attempting to swallow a pill and felt it go down the wrong way.  States that the pill is stuck in his lung.  Pt states this has happened before.  Attempted to cough it up. A&Ox4, able to talk in complete sentences.

## 2019-01-03 ENCOUNTER — Emergency Department (HOSPITAL_COMMUNITY): Payer: Medicare Other

## 2019-01-03 DIAGNOSIS — E039 Hypothyroidism, unspecified: Secondary | ICD-10-CM

## 2019-01-03 DIAGNOSIS — I959 Hypotension, unspecified: Secondary | ICD-10-CM | POA: Diagnosis not present

## 2019-01-03 DIAGNOSIS — T17908A Unspecified foreign body in respiratory tract, part unspecified causing other injury, initial encounter: Secondary | ICD-10-CM | POA: Diagnosis not present

## 2019-01-03 DIAGNOSIS — J690 Pneumonitis due to inhalation of food and vomit: Secondary | ICD-10-CM

## 2019-01-03 DIAGNOSIS — T17908S Unspecified foreign body in respiratory tract, part unspecified causing other injury, sequela: Secondary | ICD-10-CM

## 2019-01-03 DIAGNOSIS — J984 Other disorders of lung: Secondary | ICD-10-CM | POA: Diagnosis not present

## 2019-01-03 DIAGNOSIS — D72829 Elevated white blood cell count, unspecified: Secondary | ICD-10-CM

## 2019-01-03 LAB — CBC WITH DIFFERENTIAL/PLATELET
Abs Immature Granulocytes: 0.05 10*3/uL (ref 0.00–0.07)
Basophils Absolute: 0 10*3/uL (ref 0.0–0.1)
Basophils Relative: 0 %
Eosinophils Absolute: 0.2 10*3/uL (ref 0.0–0.5)
Eosinophils Relative: 2 %
HCT: 40.3 % (ref 39.0–52.0)
Hemoglobin: 13.2 g/dL (ref 13.0–17.0)
Immature Granulocytes: 0 %
Lymphocytes Relative: 12 %
Lymphs Abs: 1.4 10*3/uL (ref 0.7–4.0)
MCH: 32.9 pg (ref 26.0–34.0)
MCHC: 32.8 g/dL (ref 30.0–36.0)
MCV: 100.5 fL — ABNORMAL HIGH (ref 80.0–100.0)
Monocytes Absolute: 0.5 10*3/uL (ref 0.1–1.0)
Monocytes Relative: 5 %
Neutro Abs: 9.3 10*3/uL — ABNORMAL HIGH (ref 1.7–7.7)
Neutrophils Relative %: 81 %
Platelets: 205 10*3/uL (ref 150–400)
RBC: 4.01 MIL/uL — ABNORMAL LOW (ref 4.22–5.81)
RDW: 12.2 % (ref 11.5–15.5)
WBC: 11.5 10*3/uL — ABNORMAL HIGH (ref 4.0–10.5)
nRBC: 0 % (ref 0.0–0.2)

## 2019-01-03 LAB — BASIC METABOLIC PANEL
Anion gap: 8 (ref 5–15)
BUN: 26 mg/dL — ABNORMAL HIGH (ref 8–23)
CO2: 26 mmol/L (ref 22–32)
Calcium: 8.8 mg/dL — ABNORMAL LOW (ref 8.9–10.3)
Chloride: 105 mmol/L (ref 98–111)
Creatinine, Ser: 0.97 mg/dL (ref 0.61–1.24)
GFR calc Af Amer: >60 mL/min (ref >60)
GFR calc non Af Amer: >60 mL/min (ref >60)
Glucose, Bld: 106 mg/dL — ABNORMAL HIGH (ref 70–99)
Potassium: 3.9 mmol/L (ref 3.5–5.1)
Sodium: 139 mmol/L (ref 135–145)

## 2019-01-03 MED ORDER — HYDROCORTISONE NA SUCCINATE PF 100 MG IJ SOLR
100.0000 mg | Freq: Once | INTRAMUSCULAR | Status: AC
  Administered 2019-01-03: 100 mg via INTRAVENOUS
  Filled 2019-01-03: qty 2

## 2019-01-03 MED ORDER — SODIUM CHLORIDE 0.9% FLUSH: 3.0000 mL | INTRAVENOUS | Status: DC | PRN

## 2019-01-03 MED ORDER — SODIUM CHLORIDE 0.9% FLUSH
3.0000 mL | Freq: Two times a day (BID) | INTRAVENOUS | Status: DC
  Administered 2019-01-03: 3 mL via INTRAVENOUS

## 2019-01-03 MED ORDER — SODIUM CHLORIDE 0.9 % IV SOLN: 250.0000 mL | INTRAVENOUS | Status: DC | PRN

## 2019-01-03 MED ORDER — PIPERACILLIN-TAZOBACTAM 3.375 G IVPB
3.3750 g | Freq: Three times a day (TID) | INTRAVENOUS | Status: DC
  Administered 2019-01-03: 3.375 g via INTRAVENOUS
  Filled 2019-01-03 (×2): qty 50

## 2019-01-03 MED ORDER — PIPERACILLIN-TAZOBACTAM 3.375 G IVPB 30 MIN
3.3750 g | Freq: Once | INTRAVENOUS | Status: AC
  Administered 2019-01-03: 3.375 g via INTRAVENOUS
  Filled 2019-01-03: qty 50

## 2019-01-03 NOTE — ED Notes (Signed)
Patient transported to CT 

## 2019-01-03 NOTE — Evaluation (Signed)
Clinical/Bedside Swallow Evaluation Patient Details  Name: Dominic Garcia MRN: [id number] Date of Birth: 03-22-1926  Today's Date: 01/03/2019 Time: SLP Start Time (ACUTE ONLY): 1150 SLP Stop Time (ACUTE ONLY): 1202 SLP Time Calculation (min) (ACUTE ONLY): 12 min  Past Medical History:  Past Medical History:  Diagnosis Date  . Allergic rhinitis   . Allergic rhinoconjunctivitis   . Colon polyp 2003  . H/O peptic ulcer    at age 85  . Macular degeneration   . Macular degeneration, dry   . Melanoma ([place]) 2000   [facility] at [facility]  . Melanoma ([place])    treated at [facility]  . Memory loss   . Mixed dyslipidemia   . Prostate hypertrophy   . Reflux   . Squamous cell carcinoma    on pts scalp- local derm Dr Denna Haggard  . Thyroid disease    hypothyroid  . Thyroid disease    Past Surgical History:  Past Surgical History:  Procedure Laterality Date  . APPENDECTOMY  1937  . APPENDECTOMY    . CHOLECYSTECTOMY    . INGUINAL HERNIA REPAIR Left 2012   at [place]  . KNEE ARTHROSCOPY Left 2000   Dr Telford Nab  . MELANOMA EXCISION     arm  . MELANOMA EXCISION    . TONSILLECTOMY  1935  . TONSILLECTOMY AND ADENOIDECTOMY     HPI:   Dominic Garcia is a 85 y.o. male with medical history significant of hypothyroidism comes in after aspirating his vitamin pill earlier tonight at 77 PM.  Patient tried to cough it up unsuccessfully.  He is done this before in the past 10 years ago. Denies any other difficulty.  Observed overnight, RN reports he hat his breakfast without difficulty.    Assessment / Plan / Recommendation Clinical Impression  Pt demonstrates signs of normal swallow function. He denies any difficulty swallowing on a regular basis, no concern for recent pna or change in function. It is not abnormal for there to be changes in the timing of swallowing with advanced age; pt is 10. It is warrated that he take extra precaution with pills as poor oral manipulation of thin and pills together is quite  common even with normal swallowing function. Advised pt to try taking his pills whole in applesauce. This allows a more cohesive texture. Provided visual reinforcement of this concept with videos. No SLP f/u recommended at this time unless new problems arise, at which time an outpatient MBS could be recommended.  SLP Visit Diagnosis: Dysphagia, unspecified (R13.10)    Aspiration Risk  Mild aspiration risk    Diet Recommendation Regular;Thin liquid   Liquid Administration via: Cup;Straw Medication Administration: Whole meds with puree Supervision: Patient able to self feed Compensations: Slow rate;Small sips/bites Postural Changes: Seated upright at 90 degrees    Other  Recommendations Oral Care Recommendations: Patient independent with oral care   Follow up Recommendations None      Frequency and Duration            Prognosis        Swallow Study   General HPI:  Dominic Garcia is a 85 y.o. male with medical history significant of hypothyroidism comes in after aspirating his vitamin pill earlier tonight at 7 PM.  Patient tried to cough it up unsuccessfully.  He is done this before in the past 10 years ago. Denies any other difficulty.  Observed overnight, RN reports he hat his breakfast without difficulty.  Type of Study:  Bedside Swallow Evaluation Previous Swallow Assessment: none Diet Prior to this Study: Regular;Thin liquids Temperature Spikes Noted: No History of Recent Intubation: No Behavior/Cognition: Alert;Cooperative;Pleasant mood Oral Cavity Assessment: Within Functional Limits Oral Care Completed by SLP: No Oral Cavity - Dentition: Adequate natural dentition Vision: Functional for self-feeding Self-Feeding Abilities: Able to feed self Patient Positioning: Upright in bed Baseline Vocal Quality: Normal Volitional Cough: Strong Volitional Swallow: Able to elicit    Oral/Motor/Sensory Function     Ice Chips     Thin Liquid Thin Liquid: Within functional  limits    Nectar Thick Nectar Thick Liquid: Not tested   Honey Thick Honey Thick Liquid: Not tested   Puree Puree: Not tested   Solid     Solid: Within functional limits     [name], MA CCC-SLP  Acute Rehabilitation Services Pager [phone] Office [phone]  Lynann Beaver 01/03/2019,1:03 PM

## 2019-01-03 NOTE — Plan of Care (Signed)
  Problem: Education: Goal: Knowledge of General Education information will improve Description: Including pain rating scale, medication(s)/side effects and non-pharmacologic comfort measures Outcome: Progressing   Problem: Clinical Measurements: Goal: Ability to maintain clinical measurements within normal limits will improve Outcome: Progressing   Problem: Clinical Measurements: Goal: Respiratory complications will improve Outcome: Progressing   

## 2019-01-03 NOTE — Progress Notes (Signed)
Pharmacy Antibiotic Note  Dominic Garcia is a 85 y.o. male admitted on 01/02/2019 after aspirating a vitamin tablet, being observed for aspiration pneumonia.  Pharmacy has been consulted for Zosyn dosing.  Plan: Zosyn 3.375g IV q8h (4-hour infusion).  Height: 5\' 2"  (157.5 cm) Weight: 147 lb (66.7 kg) IBW/kg (Calculated) : 54.6  Temp (24hrs), Avg:98.6 F (37 C), Min:98.6 F (37 C), Max:98.6 F (37 C)  Recent Labs  Lab 01/03/19 0118  WBC 11.5*  CREATININE 0.97    Estimated Creatinine Clearance: 42.5 mL/min (by C-G formula based on SCr of 0.97 mg/dL).    Allergies  Allergen Reactions  . Donepezil Other (See Comments)    Made pt wobbly  . Dextromethorphan-Guaifenesin Rash  . Guaifenesin Rash and Swelling  . Mucinex [Guaifenesin Er] Swelling and Rash    Thank you for allowing pharmacy to be a part of this patient's care.  Wynona Neat, PharmD, BCPS  01/03/2019 2:09 AM

## 2019-01-03 NOTE — ED Notes (Signed)
Pccm at bedside 

## 2019-01-03 NOTE — H&P (Signed)
History and Physical    Dominic Garcia CZY:[id number] DOB: June 05, 1926 DOA: 01/02/2019  PCP: Hulan Fess, MD  Patient coming from: home  Chief Complaint: Aspirated a pill  HPI: Dominic Garcia is a 85 y.o. male with medical history significant of hypothyroidism comes in after aspirating his vitamin pill earlier tonight at 57 PM.  Patient tried to cough it up unsuccessfully.  He is done this before in the past.  Patient feels back to normal at this time.  Pulmonology was consulted for possible bronc have requested that he be observed overnight.  Patient being referred for observation overnight for aspiration.  He denies any fevers.  Review of Systems: As per HPI otherwise 10 point review of systems negative.   Past Medical History:  Diagnosis Date  . Allergic rhinitis   . Allergic rhinoconjunctivitis   . Colon polyp 2003  . H/O peptic ulcer    at age 53  . Macular degeneration   . Macular degeneration, dry   . Melanoma ([place]) 2000   treated at [facility]  . Melanoma ([place])    [facility] at [facility]  . Memory loss   . Mixed dyslipidemia   . Prostate hypertrophy   . Reflux   . Squamous cell carcinoma    on pts scalp- local derm Dr Denna Haggard  . Thyroid disease    hypothyroid  . Thyroid disease     Past Surgical History:  Procedure Laterality Date  . APPENDECTOMY  1937  . APPENDECTOMY    . CHOLECYSTECTOMY    . INGUINAL HERNIA REPAIR Left 2012   at [facility]  . KNEE ARTHROSCOPY Left 2000   Dr Telford Nab  . MELANOMA EXCISION     arm  . MELANOMA EXCISION    . TONSILLECTOMY  1935  . TONSILLECTOMY AND ADENOIDECTOMY       reports that he has quit smoking. His smoking use included cigarettes. He has a 20.00 pack-year smoking history. He has never used smokeless tobacco. He reports that he does not drink alcohol or use drugs.  Allergies  Allergen Reactions  . Donepezil Other (See Comments)    Made pt wobbly  . Dextromethorphan-Guaifenesin Rash  . Guaifenesin Rash and Swelling  . Mucinex  [Guaifenesin Er] Swelling and Rash    Family History  Problem Relation Age of Onset  . Cancer Mother   . Kidney disease Father   . Macular degeneration Brother   . Alzheimer's disease Brother   . Renal Disease Father   . Aneurysm Father   . Stroke Father   . Colon cancer Mother   . Kidney Stones Brother     Prior to Admission medications   Medication Sig Start Date End Date Taking? Authorizing Provider  aspirin EC 81 MG tablet Take 81 mg by mouth 3 (three) times a week.    [provider]  Calcium Citrate (CITRACAL PO) Take 1 tablet by mouth daily.     [provider]  cyanocobalamin 1000 MCG tablet Take 1,000 mcg by mouth daily. 5000 units daily    [provider]  fludrocortisone (FLORINEF) 0.1 MG tablet Take 100 mcg by mouth daily. 10/20/18   [provider]  levothyroxine (SYNTHROID, LEVOTHROID) 88 MCG tablet Take 88 mcg by mouth daily.    [provider]  memantine (NAMENDA XR) 28 MG CP24 24 hr capsule TAKE 1 CAPSULE BY MOUTH DAILY 10/05/18   Penumalli, Earlean Polka, MD  Multiple Vitamins-Minerals (ICAPS PO) Take 1 capsule by mouth every morning.  [provider]  tamsulosin (FLOMAX) 0.4 MG CAPS capsule Take 0.4 mg by mouth daily.    [provider]    Physical Exam: Vitals:   01/03/19 0000 01/03/19 0015 01/03/19 0030 01/03/19 0045  BP: 114/65 (!) 94/51 121/67 (!) 129/43  Pulse: 75 69 83   Resp: (!) 22 15 18    Temp:      TempSrc:      SpO2: 98% 97% 97%   Weight: 66.7 kg     Height: 5\' 2"  (1.575 m)         Constitutional: NAD, calm, comfortable Vitals:   01/03/19 0000 01/03/19 0015 01/03/19 0030 01/03/19 0045  BP: 114/65 (!) 94/51 121/67 (!) 129/43  Pulse: 75 69 83   Resp: (!) 22 15 18    Temp:      TempSrc:      SpO2: 98% 97% 97%   Weight: 66.7 kg     Height: 5\' 2"  (1.575 m)      Eyes: PERRL, lids and conjunctivae normal ENMT: Mucous membranes are moist. Posterior pharynx clear of any exudate or  lesions.Normal dentition.  Neck: normal, supple, no masses, no thyromegaly Respiratory: clear to auscultation bilaterally, no wheezing, no crackles. Normal respiratory effort. No accessory muscle use.  Cardiovascular: Regular rate and rhythm, no murmurs / rubs / gallops. No extremity edema. 2+ pedal pulses. No carotid bruits.  Abdomen: no tenderness, no masses palpated. No hepatosplenomegaly. Bowel sounds positive.  Musculoskeletal: no clubbing / cyanosis. No joint deformity upper and lower extremities. Good ROM, no contractures. Normal muscle tone.  Skin: no rashes, lesions, ulcers. No induration Neurologic: CN 2-12 grossly intact. Sensation intact, DTR normal. Strength 5/5 in all 4.  Psychiatric: Normal judgment and insight. Alert and oriented x 3. Normal mood.    Labs on Admission: I have personally reviewed following labs and imaging studies  CBC: No results for input(s): WBC, NEUTROABS, HGB, HCT, MCV, PLT in the last 168 hours. Basic Metabolic Panel: No results for input(s): NA, K, CL, CO2, GLUCOSE, BUN, CREATININE, CALCIUM, MG, PHOS in the last 168 hours. GFR: CrCl cannot be calculated (Patient's most recent lab result is older than the maximum 21 days allowed.). Liver Function Tests: No results for input(s): AST, ALT, ALKPHOS, BILITOT, PROT, ALBUMIN in the last 168 hours. No results for input(s): LIPASE, AMYLASE in the last 168 hours. No results for input(s): AMMONIA in the last 168 hours. Coagulation Profile: No results for input(s): INR, PROTIME in the last 168 hours. Cardiac Enzymes: No results for input(s): CKTOTAL, CKMB, CKMBINDEX, TROPONINI in the last 168 hours. BNP (last 3 results) No results for input(s): PROBNP in the last 8760 hours. HbA1C: No results for input(s): HGBA1C in the last 72 hours. CBG: No results for input(s): GLUCAP in the last 168 hours. Lipid Profile: No results for input(s): CHOL, HDL, LDLCALC, TRIG, CHOLHDL, LDLDIRECT in the last 72  hours. Thyroid Function Tests: No results for input(s): TSH, T4TOTAL, FREET4, T3FREE, THYROIDAB in the last 72 hours. Anemia Panel: No results for input(s): VITAMINB12, FOLATE, FERRITIN, TIBC, IRON, RETICCTPCT in the last 72 hours. Urine analysis:    Component Value Date/Time   COLORURINE YELLOW 09/03/2014 2308   APPEARANCEUR CLOUDY (A) 09/03/2014 2308   LABSPEC 1.020 09/03/2014 2308   PHURINE 6.5 09/03/2014 2308   GLUCOSEU NEGATIVE 09/03/2014 2308   HGBUR LARGE (A) 09/03/2014 2308   BILIRUBINUR NEGATIVE 09/03/2014 2308   KETONESUR NEGATIVE 09/03/2014 2308   PROTEINUR NEGATIVE 09/03/2014 2308   UROBILINOGEN 0.2 09/03/2014 2308  NITRITE NEGATIVE 09/03/2014 2308   LEUKOCYTESUR MODERATE (A) 09/03/2014 2308   Sepsis Labs: !!!!!!!!!!!!!!!!!!!!!!!!!!!!!!!!!!!!!!!!!!!! @LABRCNTIP (procalcitonin:4,lacticidven:4) )No results found for this or any previous visit (from the past 240 hour(s)).   Radiological Exams on Admission: Dg Chest 2 View  Result Date: 01/02/2019 CLINICAL DATA:  85 year old who may have aspirated a tablet earlier tonight. EXAM: CHEST - 2 VIEW COMPARISON:  08/22/2017 and earlier. FINDINGS: AP ERECT and LATERAL images were obtained. Suboptimal inspiration. Cardiac silhouette normal in size, unchanged. Thoracic aorta minimally atherosclerotic, unchanged. Hilar and mediastinal contours otherwise unremarkable. New patchy airspace opacities medially in the RIGHT LOWER LOBE. Lungs otherwise clear. Normal pulmonary vascularity. Normal bronchovascular markings. No pleural effusions. Mild degenerative changes involving the thoracic spine. IMPRESSION: Acute pneumonia involving the RIGHT LOWER LOBE, query aspiration. Electronically Signed   By: Evangeline Dakin M.D.   On: 01/02/2019 23:26   Ct Chest Wo Contrast  Result Date: 01/03/2019 CLINICAL DATA:  It is it has suspected right lung aspiration of a large pills. Asymmetric breath sounds. EXAM: CT CHEST WITHOUT CONTRAST TECHNIQUE:  Multidetector CT imaging of the chest was performed following the standard protocol without IV contrast. COMPARISON:  Chest 01/02/2019 FINDINGS: Cardiovascular: Normal heart size. No pericardial effusions. Coronary artery calcifications. Normal caliber thoracic aorta with scattered calcification. Mediastinum/Nodes: Esophagus is decompressed. No significant lymphadenopathy in the chest. Lungs/Pleura: Patchy areas of airspace disease demonstrated throughout the inferior right lower lung and right middle lung. Changes are consistent with pneumonia and could reflect aspiration. Peripheral basilar small airways are opacified suggesting secretions or aspirated material. No radiopaque pill is identified. No pleural effusions. No pneumothorax. Upper Abdomen: Increased density in the dependent stomach may represent the ingested pill. Small ventral sub xiphoid abdominal wall hernia containing fat and a portion of the transverse colon. No evidence of proximal obstruction. Musculoskeletal: No chest wall mass or suspicious bone lesions identified. IMPRESSION: Patchy airspace disease throughout the inferior right lower lung and right middle lung. Changes consistent with pneumonia and possible aspiration. Peripheral basilar small airways are opacified suggesting secretions or aspirated material. Electronically Signed   By: Lucienne Capers M.D.   On: 01/03/2019 01:04   Old chart reviewed Case discussed with EDP  Assessment/Plan 85 year old male with aspiration of vitamin pill Principal Problem:   Aspiration into airway-cover with Zosyn.  Pulmonary consulted patient worsens call pulmonary team again.  Obtain swallow evaluation.  Observe overnight on medical bed.  Active Problems:   Hypothyroidism-stable  Med rec is pending pharmacy review   DVT prophylaxis: SCDs Code Status: Full Family Communication: Wife Disposition Plan: Sunday later today Consults called: Pulmonary Admission status:  Observation   DAVID,RACHAL A MD [facility]  If 7PM-7AM, please contact night-coverage [email or web address] Password [email or web address]  01/03/2019, 1:15 AM

## 2019-01-03 NOTE — Progress Notes (Signed)
Patient is eating with no distress, speech therapy has evaluated and treatment recommendation are verbally reported from speech therapist to eat slow with head up right with bite and sips, large pills to be taken with apple sauce and pudding.

## 2019-01-05 DIAGNOSIS — D72829 Elevated white blood cell count, unspecified: Secondary | ICD-10-CM | POA: Diagnosis present

## 2019-01-05 DIAGNOSIS — J690 Pneumonitis due to inhalation of food and vomit: Secondary | ICD-10-CM | POA: Diagnosis present

## 2019-01-05 NOTE — Discharge Summary (Signed)
Dominic Garcia, is a 85 y.o. male  DOB 02-Jun-1926  MRN [id number].  Admission date:  01/02/2019  Admitting Physician  Phillips Grout, MD  Discharge Date:  01/05/2019   Primary MD  Hulan Fess, MD  Recommendations for primary care physician for things to follow:   Augmentin sent in after discharge for treatment.  Admission Diagnosis  Aspiration into airway, initial encounter [T17.908A] Principal problem:   Aspiration into airway Active problems:   Hypothyroidism  Discharge Diagnosis  Aspiration into airway, initial encounter [T17.908A]    Principal Problem:   Aspiration into airway Active Problems:   Hypothyroidism   Hypotension   Leukocytosis   Aspiration pneumonia ([place])      Past Medical History:  Diagnosis Date  . Allergic rhinitis   . Allergic rhinoconjunctivitis   . Colon polyp 2003  . H/O peptic ulcer    at age 42  . Macular degeneration   . Macular degeneration, dry   . Melanoma ([place]) 2000   treated at [facility]  . Melanoma ([place])    treated at [facility]  . Memory loss   . Mixed dyslipidemia   . Prostate hypertrophy   . Reflux   . Squamous cell carcinoma    on pts scalp- local derm Dr Denna Haggard  . Thyroid disease    hypothyroid  . Thyroid disease     Past Surgical History:  Procedure Laterality Date  . APPENDECTOMY  1937  . APPENDECTOMY    . CHOLECYSTECTOMY    . INGUINAL HERNIA REPAIR Left 2012   at [facility]  . KNEE ARTHROSCOPY Left 2000   Dr Telford Nab  . MELANOMA EXCISION     arm  . MELANOMA EXCISION    . TONSILLECTOMY  1935  . TONSILLECTOMY AND ADENOIDECTOMY         HPI  from the history and physical done on the day of admission:    is a 85 y.o. male with medical history significant of hypothyroidism comes in after aspirating his vitamin pill earlier tonight at 7 PM.  Patient tried to cough  it up unsuccessfully.  He is done this before in the past.  Patient feels back to normal at this time.  Pulmonology was consulted for possible bronc have requested that he be observed overnight.  Patient being referred for observation overnight for aspiration.  He denies any fevers.     Hospital Course:   1.  Aspiration into the airway with aspiration pneumonia: Acute.  Reports aspirating vitamin down the wrong way.  Chest x-ray showing right lower lobe pneumonia consistent with aspiration.  Patient denied any fevers. had initially been started on Zosyn IV coverage.  Speech therapy was consulted noting normal swallowing function, and recommended swallowing pills with applesauce. Patient also a advised to keep neck flexed decreased risk of aspiration.  Prescription sent for Augmentin to patients pharmacy.  2.  Leukocytosis: Acute.  On admission WBC noted to be 11.5.  Suspect aspiration event as cause.  3. Hypothyroidism: Chronic.  Continued on levothyroxine  4.  Hypotension: Chronic.  Patient's systolic blood pressures stayed in the high 90s to low 100s, but no signs of lightheadedness or dizziness with changes in position noted.  All other medical conditions appear stable and medications to treat issues were continued.  Follow UP  Follow-up Information    Little, Lennette Bihari, MD. Schedule an appointment as soon as possible for a visit in 1 week(s).   Specialty:  Family Medicine Why:  make f/u appointment in 1-2 weeks Contact information: [place] [address] [phone]            Consults obtained: Speech therapy  Discharge Condition: Stable  Diet and Activity recommendation: See Discharge Instructions below   Discharge Instructions    Diet - low sodium heart healthy   Complete by:  As directed    Discharge instructions   Complete by:  As directed    Please eat slow and take large pills with applesauce. Bend head to reduce risk of aspiration with  swallowing.   Increase activity slowly   Complete by:  As directed         Discharge Medications     Allergies as of 01/03/2019      Reactions   Donepezil Other (See Comments)   Made pt wobbly   Dextromethorphan-guaifenesin Rash   Guaifenesin Rash, Swelling   Mucinex [guaifenesin Er] Swelling, Rash      Medication List    TAKE these medications   aspirin EC 81 MG tablet Take 81 mg by mouth 3 (three) times a week.   CITRACAL PO Take 1 tablet by mouth daily.   cyanocobalamin 1000 MCG tablet Take 1,000 mcg by mouth daily. 5000 units daily   fludrocortisone 0.1 MG tablet Commonly known as:  FLORINEF Take 100 mcg by mouth daily.   ICAPS PO Take 1 capsule by mouth every morning.   levothyroxine 88 MCG tablet Commonly known as:  SYNTHROID, LEVOTHROID Take 88 mcg by mouth daily.   memantine 28 MG Cp24 24 hr capsule Commonly known as:  NAMENDA XR TAKE 1 CAPSULE BY MOUTH DAILY   NASACORT ALLERGY 24HR 55 MCG/ACT Aero nasal inhaler Generic drug:  triamcinolone Place 1 spray into the nose daily.   tamsulosin 0.4 MG Caps capsule Commonly known as:  FLOMAX Take 0.4 mg by mouth daily.       Major procedures and Radiology Reports - PLEASE review detailed and final reports for all details, in brief -    Dg Chest 2 View  Result Date: 01/02/2019 CLINICAL DATA:  85 year old who may have aspirated a tablet earlier tonight. EXAM: CHEST - 2 VIEW COMPARISON:  08/22/2017 and earlier. FINDINGS: AP ERECT and LATERAL images were obtained. Suboptimal inspiration. Cardiac silhouette normal in size, unchanged. Thoracic aorta minimally atherosclerotic, unchanged. Hilar and mediastinal contours otherwise unremarkable. New patchy airspace opacities medially in the RIGHT LOWER LOBE. Lungs otherwise clear. Normal pulmonary vascularity. Normal bronchovascular markings. No pleural effusions. Mild degenerative changes involving the thoracic spine. IMPRESSION: Acute pneumonia involving the RIGHT  LOWER LOBE, query aspiration. Electronically Signed   By: Evangeline Dakin M.D.   On: 01/02/2019 23:26   Ct Chest Wo Contrast  Result Date: 01/03/2019 CLINICAL DATA:  It is it has suspected right lung aspiration of a large pills. Asymmetric breath sounds. EXAM: CT CHEST WITHOUT CONTRAST TECHNIQUE: Multidetector CT imaging of the chest was performed following the standard protocol without IV contrast. COMPARISON:  Chest 01/02/2019 FINDINGS: Cardiovascular: Normal heart size. No pericardial effusions. Coronary artery calcifications.  Normal caliber thoracic aorta with scattered calcification. Mediastinum/Nodes: Esophagus is decompressed. No significant lymphadenopathy in the chest. Lungs/Pleura: Patchy areas of airspace disease demonstrated throughout the inferior right lower lung and right middle lung. Changes are consistent with pneumonia and could reflect aspiration. Peripheral basilar small airways are opacified suggesting secretions or aspirated material. No radiopaque pill is identified. No pleural effusions. No pneumothorax. Upper Abdomen: Increased density in the dependent stomach may represent the ingested pill. Small ventral sub xiphoid abdominal wall hernia containing fat and a portion of the transverse colon. No evidence of proximal obstruction. Musculoskeletal: No chest wall mass or suspicious bone lesions identified. IMPRESSION: Patchy airspace disease throughout the inferior right lower lung and right middle lung. Changes consistent with pneumonia and possible aspiration. Peripheral basilar small airways are opacified suggesting secretions or aspirated material. Electronically Signed   By: Lucienne Capers M.D.   On: 01/03/2019 01:04    Micro Results     No results found for this or any previous visit (from the past 240 hour(s)).     Today   Subjective    Margorie John today reports having no problem eating breakfast.  Patient reports the only other time that this occurred was  when he aspirated a tooth.   Objective   Blood pressure (!) 98/49, pulse 77, temperature 98.7 F (37.1 C), temperature source Oral, resp. rate 18, height 5\' 2"  (1.575 m), weight 66.7 kg, SpO2 95 %.  No intake or output data in the 24 hours ending 01/05/19 4196  Exam  Constitutional: Elderly male in NAD, calm, comfortable Eyes: PERRL, lids and conjunctivae normal ENMT: Mucous membranes are moist. Posterior pharynx clear of any exudate or lesions.  Neck: normal, supple, no masses, no thyromegaly Respiratory: clear to auscultation bilaterally, no wheezing, no crackles. Normal respiratory effort. No accessory muscle use.  Cardiovascular: Regular rate and rhythm, no murmurs / rubs / gallops. No extremity edema. 2+ pedal pulses. No carotid bruits.  Abdomen: no tenderness, no masses palpated. No hepatosplenomegaly. Bowel sounds positive.  Musculoskeletal: no clubbing / cyanosis. No joint deformity upper and lower extremities. Good ROM, no contractures. Normal muscle tone.  Skin: no rashes, lesions, ulcers. No induration Neurologic: CN 2-12 grossly intact. Sensation intact, DTR normal. Strength 5/5 in all 4.  Psychiatric: Normal judgment and insight. Alert and oriented x 3. Normal mood.    Data Review   CBC w Diff:  Lab Results  Component Value Date   WBC 11.5 (H) 01/03/2019   HGB 13.2 01/03/2019   HCT 40.3 01/03/2019   PLT 205 01/03/2019   LYMPHOPCT 12 01/03/2019   MONOPCT 5 01/03/2019   EOSPCT 2 01/03/2019   BASOPCT 0 01/03/2019    CMP:  Lab Results  Component Value Date   NA 139 01/03/2019   K 3.9 01/03/2019   CL 105 01/03/2019   CO2 26 01/03/2019   BUN 26 (H) 01/03/2019   CREATININE 0.97 01/03/2019   PROT 7.0 02/27/2013   ALBUMIN 3.7 02/27/2013   BILITOT 0.4 02/27/2013   ALKPHOS 77 02/27/2013   AST 22 02/27/2013   ALT 15 02/27/2013  .   Total Time in preparing paper work, data evaluation and todays exam - 30 minutes  Norval Morton M.D on 01/05/2019 at [place]  [phone]

## 2019-01-07 DIAGNOSIS — Z8701 Personal history of pneumonia (recurrent): Secondary | ICD-10-CM | POA: Diagnosis not present

## 2019-01-07 DIAGNOSIS — T17890D Other foreign object in other parts of respiratory tract causing asphyxiation, subsequent encounter: Secondary | ICD-10-CM | POA: Diagnosis not present

## 2019-02-05 DIAGNOSIS — J690 Pneumonitis due to inhalation of food and vomit: Secondary | ICD-10-CM | POA: Diagnosis not present

## 2019-02-16 DIAGNOSIS — L570 Actinic keratosis: Secondary | ICD-10-CM | POA: Diagnosis not present

## 2019-02-23 ENCOUNTER — Ambulatory Visit: Payer: Medicare Other | Admitting: Podiatry

## 2019-03-17 DIAGNOSIS — H6122 Impacted cerumen, left ear: Secondary | ICD-10-CM | POA: Diagnosis not present

## 2019-05-10 DIAGNOSIS — L821 Other seborrheic keratosis: Secondary | ICD-10-CM | POA: Diagnosis not present

## 2019-05-10 DIAGNOSIS — D229 Melanocytic nevi, unspecified: Secondary | ICD-10-CM | POA: Diagnosis not present

## 2019-05-27 ENCOUNTER — Telehealth: Payer: Self-pay | Admitting: Physician Assistant

## 2019-05-27 NOTE — Telephone Encounter (Signed)
New Message   Pt c/o BP issue:  1. What are your last 5 BP readings? 110/50, 91/51, 90/54, 87/53, 95/49 2. Are you having any other symptoms (ex. Dizziness, headache, blurred vision, passed out)? Only seems real tired  3. What is your medication issue? No

## 2019-05-27 NOTE — Telephone Encounter (Signed)
I spoke to the patient who called to advise [place] of his Bps.  He "feels great" and is asymptomatic.  I told him that he has a hx of hypotension and to keep himself well hydrated and notify us if he develops symptoms.  He verbalized understanding.

## 2019-06-01 DIAGNOSIS — H353131 Nonexudative age-related macular degeneration, bilateral, early dry stage: Secondary | ICD-10-CM | POA: Diagnosis not present

## 2019-06-01 DIAGNOSIS — H4321 Crystalline deposits in vitreous body, right eye: Secondary | ICD-10-CM | POA: Diagnosis not present

## 2019-06-01 DIAGNOSIS — H1859 Other hereditary corneal dystrophies: Secondary | ICD-10-CM | POA: Diagnosis not present

## 2019-06-01 DIAGNOSIS — Z961 Presence of intraocular lens: Secondary | ICD-10-CM | POA: Diagnosis not present

## 2019-07-08 DIAGNOSIS — M40203 Unspecified kyphosis, cervicothoracic region: Secondary | ICD-10-CM | POA: Diagnosis not present

## 2019-07-13 ENCOUNTER — Other Ambulatory Visit: Payer: Self-pay | Admitting: Family Medicine

## 2019-07-13 DIAGNOSIS — M810 Age-related osteoporosis without current pathological fracture: Secondary | ICD-10-CM

## 2019-07-13 DIAGNOSIS — M40203 Unspecified kyphosis, cervicothoracic region: Secondary | ICD-10-CM

## 2019-08-04 ENCOUNTER — Other Ambulatory Visit: Payer: Self-pay | Admitting: Family Medicine

## 2019-08-04 DIAGNOSIS — Z13820 Encounter for screening for osteoporosis: Secondary | ICD-10-CM

## 2019-08-04 DIAGNOSIS — M40203 Unspecified kyphosis, cervicothoracic region: Secondary | ICD-10-CM

## 2019-08-06 ENCOUNTER — Other Ambulatory Visit: Payer: Self-pay

## 2019-08-06 ENCOUNTER — Ambulatory Visit
Admission: RE | Admit: 2019-08-06 | Discharge: 2019-08-06 | Disposition: A | Discharge status: Home / Self Care | Payer: Medicare Other | Source: Ambulatory Visit | Attending: Family Medicine | Admitting: Family Medicine

## 2019-08-06 DIAGNOSIS — Z13820 Encounter for screening for osteoporosis: Secondary | ICD-10-CM

## 2019-08-06 DIAGNOSIS — M40203 Unspecified kyphosis, cervicothoracic region: Secondary | ICD-10-CM

## 2019-08-06 DIAGNOSIS — M85832 Other specified disorders of bone density and structure, left forearm: Secondary | ICD-10-CM | POA: Diagnosis not present

## 2019-08-12 DIAGNOSIS — R413 Other amnesia: Secondary | ICD-10-CM | POA: Diagnosis not present

## 2019-08-12 DIAGNOSIS — M85839 Other specified disorders of bone density and structure, unspecified forearm: Secondary | ICD-10-CM | POA: Diagnosis not present

## 2019-08-12 DIAGNOSIS — Z8701 Personal history of pneumonia (recurrent): Secondary | ICD-10-CM | POA: Diagnosis not present

## 2019-08-12 DIAGNOSIS — I959 Hypotension, unspecified: Secondary | ICD-10-CM | POA: Diagnosis not present

## 2019-08-12 DIAGNOSIS — E782 Mixed hyperlipidemia: Secondary | ICD-10-CM | POA: Diagnosis not present

## 2019-08-12 DIAGNOSIS — Z85820 Personal history of malignant melanoma of skin: Secondary | ICD-10-CM | POA: Diagnosis not present

## 2019-08-12 DIAGNOSIS — H3530 Unspecified macular degeneration: Secondary | ICD-10-CM | POA: Diagnosis not present

## 2019-08-12 DIAGNOSIS — N401 Enlarged prostate with lower urinary tract symptoms: Secondary | ICD-10-CM | POA: Diagnosis not present

## 2019-08-12 DIAGNOSIS — Z79899 Other long term (current) drug therapy: Secondary | ICD-10-CM | POA: Diagnosis not present

## 2019-08-12 DIAGNOSIS — E039 Hypothyroidism, unspecified: Secondary | ICD-10-CM | POA: Diagnosis not present

## 2019-08-16 DIAGNOSIS — S0001XA Abrasion of scalp, initial encounter: Secondary | ICD-10-CM | POA: Diagnosis not present

## 2019-08-16 DIAGNOSIS — R400 Somnolence: Secondary | ICD-10-CM | POA: Diagnosis not present

## 2019-08-17 DIAGNOSIS — R413 Other amnesia: Secondary | ICD-10-CM | POA: Diagnosis not present

## 2019-08-17 DIAGNOSIS — E782 Mixed hyperlipidemia: Secondary | ICD-10-CM | POA: Diagnosis not present

## 2019-08-17 DIAGNOSIS — Z79899 Other long term (current) drug therapy: Secondary | ICD-10-CM | POA: Diagnosis not present

## 2019-08-17 DIAGNOSIS — Z85820 Personal history of malignant melanoma of skin: Secondary | ICD-10-CM | POA: Diagnosis not present

## 2019-08-17 DIAGNOSIS — Z8701 Personal history of pneumonia (recurrent): Secondary | ICD-10-CM | POA: Diagnosis not present

## 2019-08-17 DIAGNOSIS — M85839 Other specified disorders of bone density and structure, unspecified forearm: Secondary | ICD-10-CM | POA: Diagnosis not present

## 2019-08-17 DIAGNOSIS — H3530 Unspecified macular degeneration: Secondary | ICD-10-CM | POA: Diagnosis not present

## 2019-08-17 DIAGNOSIS — I959 Hypotension, unspecified: Secondary | ICD-10-CM | POA: Diagnosis not present

## 2019-08-17 DIAGNOSIS — E039 Hypothyroidism, unspecified: Secondary | ICD-10-CM | POA: Diagnosis not present

## 2019-08-17 DIAGNOSIS — N401 Enlarged prostate with lower urinary tract symptoms: Secondary | ICD-10-CM | POA: Diagnosis not present

## 2019-08-26 DIAGNOSIS — M5440 Lumbago with sciatica, unspecified side: Secondary | ICD-10-CM | POA: Diagnosis not present

## 2019-08-26 DIAGNOSIS — R295 Transient paralysis: Secondary | ICD-10-CM | POA: Diagnosis not present

## 2019-09-14 DIAGNOSIS — Z23 Encounter for immunization: Secondary | ICD-10-CM | POA: Diagnosis not present

## 2019-09-17 ENCOUNTER — Other Ambulatory Visit: Payer: Self-pay | Admitting: Cardiology

## 2019-10-06 DIAGNOSIS — K219 Gastro-esophageal reflux disease without esophagitis: Secondary | ICD-10-CM | POA: Diagnosis not present

## 2019-11-09 ENCOUNTER — Telehealth: Payer: Self-pay | Admitting: Cardiology

## 2019-11-09 NOTE — Telephone Encounter (Signed)
Spoke with Ebony at Dr. Eddie Dibbles office. She called to ask if the patient should be taking Florinef because he has not been picking up refills since September.  Informed Charlena Cross that the patient was last evaluated at [facility] August 2019 and no formal follow-up was arranged at that time (the patient was instructed to call if he needs anything).  After some research, she states Dr. Rex Kras has been prescribing the florinef so she will ask him. She talked to the patient recently and he had no complaints. His last BP in the office there was 104/61.   She will talk with Dr. Rex Kras about continuing the medication. She will call [facility] if Dr. Rex Kras would like cardiology follow-up arranged.

## 2019-11-09 NOTE — Telephone Encounter (Signed)
Pt c/o medication issue:  1. Name of Medication: fludrocortisone (FLORINEF) 0.1 MG tablet  2. How are you currently taking this medication (dosage and times per day)? Not currently taking medication  3. Are you having a reaction (difficulty breathing--STAT)? No  4. What is your medication issue? Charlena Cross is calling because patient is not taking medication has stopped since September and wants to know if he needs to continue this medication and if so cardiology needs to refill it.

## 2019-11-11 DIAGNOSIS — E782 Mixed hyperlipidemia: Secondary | ICD-10-CM | POA: Diagnosis not present

## 2019-11-11 DIAGNOSIS — N401 Enlarged prostate with lower urinary tract symptoms: Secondary | ICD-10-CM | POA: Diagnosis not present

## 2019-11-11 DIAGNOSIS — E039 Hypothyroidism, unspecified: Secondary | ICD-10-CM | POA: Diagnosis not present

## 2019-11-11 DIAGNOSIS — N400 Enlarged prostate without lower urinary tract symptoms: Secondary | ICD-10-CM | POA: Diagnosis not present

## 2019-12-22 ENCOUNTER — Other Ambulatory Visit: Payer: Self-pay | Admitting: Dermatology

## 2020-01-14 DIAGNOSIS — R634 Abnormal weight loss: Secondary | ICD-10-CM | POA: Diagnosis not present

## 2020-01-20 DIAGNOSIS — E782 Mixed hyperlipidemia: Secondary | ICD-10-CM | POA: Diagnosis not present

## 2020-01-20 DIAGNOSIS — E039 Hypothyroidism, unspecified: Secondary | ICD-10-CM | POA: Diagnosis not present

## 2020-01-20 DIAGNOSIS — N400 Enlarged prostate without lower urinary tract symptoms: Secondary | ICD-10-CM | POA: Diagnosis not present

## 2020-01-20 DIAGNOSIS — N401 Enlarged prostate with lower urinary tract symptoms: Secondary | ICD-10-CM | POA: Diagnosis not present

## 2020-01-27 DIAGNOSIS — D229 Melanocytic nevi, unspecified: Secondary | ICD-10-CM | POA: Diagnosis not present

## 2020-01-27 DIAGNOSIS — L570 Actinic keratosis: Secondary | ICD-10-CM | POA: Diagnosis not present

## 2020-02-04 ENCOUNTER — Telehealth: Payer: Self-pay | Admitting: Cardiology

## 2020-02-04 NOTE — Telephone Encounter (Signed)
Patient's wife calling to request a handicap sticker.

## 2020-02-04 NOTE — Telephone Encounter (Signed)
See phone note from patient's wife Quoc Kaseman) calling regarding handicap stickers.

## 2020-02-10 DIAGNOSIS — N400 Enlarged prostate without lower urinary tract symptoms: Secondary | ICD-10-CM | POA: Diagnosis not present

## 2020-02-10 DIAGNOSIS — N401 Enlarged prostate with lower urinary tract symptoms: Secondary | ICD-10-CM | POA: Diagnosis not present

## 2020-02-10 DIAGNOSIS — E039 Hypothyroidism, unspecified: Secondary | ICD-10-CM | POA: Diagnosis not present

## 2020-02-10 DIAGNOSIS — E782 Mixed hyperlipidemia: Secondary | ICD-10-CM | POA: Diagnosis not present

## 2020-02-20 ENCOUNTER — Ambulatory Visit: Payer: Medicare Other | Attending: Internal Medicine

## 2020-02-20 DIAGNOSIS — Z23 Encounter for immunization: Secondary | ICD-10-CM

## 2020-02-20 NOTE — Progress Notes (Signed)
   Covid-19 Vaccination Clinic  Name:  Dominic Garcia    MRN: [id number] DOB: 21-Aug-1926  02/20/2020  Mr. Makonnen Leibensperger was observed post Covid-19 immunization for 15 minutes without incidence. He was provided with Vaccine Information Sheet and instruction to access the V-Safe system.   Mr. Maddoc Gabay was instructed to call 911 with any severe reactions post vaccine: Marland Kitchen Difficulty breathing  . Swelling of your face and throat  . A fast heartbeat  . A bad rash all over your body  . Dizziness and weakness    Immunizations Administered    Name Date Dose VIS Date Route   Pfizer COVID-19 Vaccine 02/20/2020  2:04 PM 0.3 mL 12/03/2019 Intramuscular   Manufacturer: [place]   Lot: [id number]   [place]: [id number]

## 2020-03-09 DIAGNOSIS — N401 Enlarged prostate with lower urinary tract symptoms: Secondary | ICD-10-CM | POA: Diagnosis not present

## 2020-03-09 DIAGNOSIS — N400 Enlarged prostate without lower urinary tract symptoms: Secondary | ICD-10-CM | POA: Diagnosis not present

## 2020-03-09 DIAGNOSIS — E782 Mixed hyperlipidemia: Secondary | ICD-10-CM | POA: Diagnosis not present

## 2020-03-09 DIAGNOSIS — E039 Hypothyroidism, unspecified: Secondary | ICD-10-CM | POA: Diagnosis not present

## 2020-03-21 ENCOUNTER — Ambulatory Visit: Payer: Medicare Other | Attending: Internal Medicine

## 2020-03-21 DIAGNOSIS — Z23 Encounter for immunization: Secondary | ICD-10-CM

## 2020-03-21 NOTE — Progress Notes (Signed)
   Covid-19 Vaccination Clinic  Name:  [name]    MRN: [id number] DOB: 07-13-1926  03/21/2020  Dominic Garcia was observed post Covid-19 immunization for 30 minutes based on pre-vaccination screening without incident. He was provided with Vaccine Information Sheet and instruction to access the V-Safe system.   Mr. [name] was instructed to call 911 with any severe reactions post vaccine: Marland Kitchen Difficulty breathing  . Swelling of face and throat  . A fast heartbeat  . A bad rash all over body  . Dizziness and weakness   Immunizations Administered    Name Date Dose VIS Date Route   Pfizer COVID-19 Vaccine 03/21/2020 10:29 AM 0.3 mL 12/03/2019 Intramuscular   Manufacturer: [place]   Lot: [id number]   [place]: [id number]

## 2020-03-27 DIAGNOSIS — Q678 Other congenital deformities of chest: Secondary | ICD-10-CM | POA: Diagnosis not present

## 2020-04-24 DIAGNOSIS — E039 Hypothyroidism, unspecified: Secondary | ICD-10-CM | POA: Diagnosis not present

## 2020-04-24 DIAGNOSIS — E782 Mixed hyperlipidemia: Secondary | ICD-10-CM | POA: Diagnosis not present

## 2020-04-24 DIAGNOSIS — N400 Enlarged prostate without lower urinary tract symptoms: Secondary | ICD-10-CM | POA: Diagnosis not present

## 2020-04-24 DIAGNOSIS — N401 Enlarged prostate with lower urinary tract symptoms: Secondary | ICD-10-CM | POA: Diagnosis not present

## 2020-05-08 ENCOUNTER — Other Ambulatory Visit: Payer: Self-pay

## 2020-05-08 ENCOUNTER — Ambulatory Visit (INDEPENDENT_AMBULATORY_CARE_PROVIDER_SITE_OTHER): Payer: Medicare Other | Admitting: Dermatology

## 2020-05-08 ENCOUNTER — Encounter: Payer: Self-pay | Admitting: Dermatology

## 2020-05-08 DIAGNOSIS — D485 Neoplasm of uncertain behavior of skin: Secondary | ICD-10-CM | POA: Diagnosis not present

## 2020-05-08 DIAGNOSIS — C4491 Basal cell carcinoma of skin, unspecified: Secondary | ICD-10-CM

## 2020-05-08 DIAGNOSIS — L570 Actinic keratosis: Secondary | ICD-10-CM

## 2020-05-08 DIAGNOSIS — C44319 Basal cell carcinoma of skin of other parts of face: Secondary | ICD-10-CM | POA: Diagnosis not present

## 2020-05-08 HISTORY — DX: Basal cell carcinoma of skin, unspecified: C44.91

## 2020-05-08 NOTE — Patient Instructions (Signed)

## 2020-05-11 ENCOUNTER — Telehealth: Payer: Self-pay | Admitting: Dermatology

## 2020-05-11 ENCOUNTER — Encounter: Payer: Self-pay | Admitting: Dermatology

## 2020-05-11 NOTE — Telephone Encounter (Signed)
-----   Message from Lavonna Monarch, MD sent at 05/11/2020  6:59 AM EDT ----- Schedule surgery with Dr. Darene Lamer

## 2020-05-11 NOTE — Telephone Encounter (Signed)
Phone call to patient's wife Lelon Frohlich to let her know that when the patient comes in for his appointment it's for a surgical procedure.  I explained the procedure to the patient's wife Lelon Frohlich.

## 2020-05-11 NOTE — Telephone Encounter (Signed)
Phone call to patient with his pathology results.  Results given to patient appointment already scheduled.

## 2020-05-11 NOTE — Telephone Encounter (Signed)
Wants to know if he's coming in for a refreeze or another biopsy or ?

## 2020-05-11 NOTE — Telephone Encounter (Signed)
Patient's wife, Lelon Frohlich, is calling for pathology results for [name].  Chart # [id number]

## 2020-05-11 NOTE — Telephone Encounter (Signed)
Patient's wife, Mohnish Mcglynn, says she is calling because she has not heard from our office regarding whether the surgical appointment scheduled is for a refreeze or another biopsy or what is to be expected?

## 2020-05-13 ENCOUNTER — Encounter: Payer: Self-pay | Admitting: Dermatology

## 2020-05-13 NOTE — Progress Notes (Signed)
   Follow-Up Visit   Subjective  Dominic Garcia is a 86 y.o. male who presents for the following: Skin Problem (Patient here today for red spots on his face that are concerning to his wife x 1 month. Per patient no bleeding and they don't bother him. Patient's wife Dominic Garcia with him today).  New growths Location: Face Duration: 4 months Quality:  Associated Signs/Symptoms: Modifying Factors:  Severity:  Timing: Context: History of multiple nonmelanoma skin cancers and melanoma  The following portions of the chart were reviewed this encounter and updated as appropriate: Tobacco  Allergies  Meds  Problems  Med Hx  Surg Hx  Fam Hx      Objective  Well appearing patient in no apparent distress; mood and affect are within normal limits.  A focused examination was performed including Head and neck examined.. Relevant physical exam findings are noted in the Assessment and Plan.   Assessment & Plan  AK (actinic keratosis) (2) Mid Forehead; Right Buccal Cheek   Destruction of lesion - Mid Forehead, Right Buccal Cheek   Destruction method: cryotherapy   Informed consent: discussed and consent obtained   Timeout:  patient name, date of birth, surgical site, and procedure verified Lesion destroyed using liquid nitrogen: Yes   Region frozen until ice ball extended beyond lesion: Yes   Cryotherapy cycles:  5 Outcome: patient tolerated procedure well with no complications    Neoplasm of uncertain behavior of skin Right Buccal Cheek   Skin / nail biopsy Type of biopsy: tangential   Informed consent: discussed and consent obtained   Timeout: patient name, date of birth, surgical site, and procedure verified   Procedure prep:  Patient was prepped and draped in usual sterile fashion Prep type:  Chlorhexidine Anesthesia: the lesion was anesthetized in a standard fashion   Anesthetic:  1% lidocaine w/ epinephrine 1-100,000 local infiltration Instrument used: flexible razor blade    Hemostasis achieved with: ferric subsulfate   Outcome: patient tolerated procedure well   Post-procedure details: wound care instructions given    Specimen 1 - Surgical pathology Differential Diagnosis: BCC vs SCC Check Margins: No  No recurrent nonmelanoma skin cancer on areas examined.

## 2020-06-01 ENCOUNTER — Other Ambulatory Visit: Payer: Self-pay

## 2020-06-01 ENCOUNTER — Ambulatory Visit (INDEPENDENT_AMBULATORY_CARE_PROVIDER_SITE_OTHER): Payer: Medicare Other | Admitting: Dermatology

## 2020-06-01 ENCOUNTER — Encounter: Payer: Self-pay | Admitting: Dermatology

## 2020-06-01 DIAGNOSIS — C44319 Basal cell carcinoma of skin of other parts of face: Secondary | ICD-10-CM | POA: Diagnosis not present

## 2020-06-01 DIAGNOSIS — L988 Other specified disorders of the skin and subcutaneous tissue: Secondary | ICD-10-CM | POA: Diagnosis not present

## 2020-06-01 NOTE — Patient Instructions (Signed)

## 2020-06-06 DIAGNOSIS — H353131 Nonexudative age-related macular degeneration, bilateral, early dry stage: Secondary | ICD-10-CM | POA: Diagnosis not present

## 2020-06-06 DIAGNOSIS — Z961 Presence of intraocular lens: Secondary | ICD-10-CM | POA: Diagnosis not present

## 2020-06-08 ENCOUNTER — Ambulatory Visit (INDEPENDENT_AMBULATORY_CARE_PROVIDER_SITE_OTHER): Payer: Medicare Other | Admitting: *Deleted

## 2020-06-08 ENCOUNTER — Other Ambulatory Visit: Payer: Self-pay

## 2020-06-08 DIAGNOSIS — Z4802 Encounter for removal of sutures: Secondary | ICD-10-CM

## 2020-06-08 DIAGNOSIS — C44319 Basal cell carcinoma of skin of other parts of face: Secondary | ICD-10-CM

## 2020-06-08 NOTE — Progress Notes (Signed)
Here for suture removal on right cheek. Pathology results to patient margins clear and follow up 6 months. Healing well. Wife in room with patient

## 2020-07-01 ENCOUNTER — Encounter: Payer: Self-pay | Admitting: Dermatology

## 2020-07-01 NOTE — Progress Notes (Signed)
   Follow-Up Visit   Subjective  Dominic Garcia is a 86 y.o. male who presents for the following: Procedure (Here for surgery BCC Superficial/Nodular on Right Buccal cheek).  BCC Location: Right cheek Duration:  Quality:  Associated Signs/Symptoms: Modifying Factors:  Severity:  Timing: Context: For treatment  Objective  Well appearing patient in no apparent distress; mood and affect are within normal limits.  A focused examination of the head and neck was performed.   Assessment & Plan   Patient Instructions  Biopsy, Surgery (Curettage) & Surgery (Excision) Aftercare Instructions  1. Okay to remove bandage in 24 hours  2. Wash area with soap and water  3. Apply Vaseline to area twice daily until healed (Not Neosporin)  4. Okay to cover with a Band-Aid to decrease the chance of infection or prevent irritation from clothing; also it's okay to uncover lesion at home.  5. Suture instructions: return to our office in 7-10 or 10-14 days for a nurse visit for suture removal. Variable healing with sutures, if pain or itching occurs call our office. It's okay to shower or bathe 24 hours after sutures are given.  6. The following risks may occur after a biopsy, curettage or excision: bleeding, scarring, discoloration, recurrence, infection (redness, yellow drainage, pain or swelling).  7. For questions, concerns and results call our office at [place] before 4pm & Friday before 3pm. Biopsy results will be available in 1 week.     Basal cell carcinoma (BCC) of right cheek Right Buccal Cheek   Skin excision  Lesion length (cm):  2.2 Lesion width (cm):  2 Margin per side (cm):  0.1 Total excision diameter (cm):  2.4 Informed consent: discussed and consent obtained   Timeout: patient name, date of birth, surgical site, and procedure verified   Procedure prep:  Patient was prepped and draped in usual sterile fashion Prep type:  Chlorhexidine Anesthesia: the  lesion was anesthetized in a standard fashion   Anesthetic:  1% lidocaine w/ epinephrine 1-100,000 local infiltration Instrument used: #15 blade   Hemostasis achieved with: suture   Outcome: patient tolerated procedure well with no complications   Post-procedure details: sterile dressing applied and wound care instructions given   Dressing type: petrolatum    Specimen 1 - Surgical pathology Differential Diagnosis: bcc [id number]  Check Margins: yes- medial margin stained Cx3& EXC 4-0 Vicryl x 1 5-0 Nyon x 4 2.2cm  Initial curettage x3 showed depth more than with, base margins cauterized, repeat curettage, narrow margin excision with layered closure.     I, Lavonna Monarch, MD, have reviewed all documentation for this visit.  The documentation on 07/01/20 for the exam, diagnosis, procedures, and orders are all accurate and complete.

## 2020-08-10 DIAGNOSIS — I959 Hypotension, unspecified: Secondary | ICD-10-CM | POA: Diagnosis not present

## 2020-08-10 DIAGNOSIS — E782 Mixed hyperlipidemia: Secondary | ICD-10-CM | POA: Diagnosis not present

## 2020-08-10 DIAGNOSIS — Z0000 Encounter for general adult medical examination without abnormal findings: Secondary | ICD-10-CM | POA: Diagnosis not present

## 2020-08-10 DIAGNOSIS — F0390 Unspecified dementia without behavioral disturbance: Secondary | ICD-10-CM | POA: Diagnosis not present

## 2020-08-10 DIAGNOSIS — M85839 Other specified disorders of bone density and structure, unspecified forearm: Secondary | ICD-10-CM | POA: Diagnosis not present

## 2020-08-10 DIAGNOSIS — E039 Hypothyroidism, unspecified: Secondary | ICD-10-CM | POA: Diagnosis not present

## 2020-08-10 DIAGNOSIS — R899 Unspecified abnormal finding in specimens from other organs, systems and tissues: Secondary | ICD-10-CM | POA: Diagnosis not present

## 2020-08-10 DIAGNOSIS — Z79899 Other long term (current) drug therapy: Secondary | ICD-10-CM | POA: Diagnosis not present

## 2020-08-10 DIAGNOSIS — D649 Anemia, unspecified: Secondary | ICD-10-CM | POA: Diagnosis not present

## 2020-08-10 DIAGNOSIS — N400 Enlarged prostate without lower urinary tract symptoms: Secondary | ICD-10-CM | POA: Diagnosis not present

## 2020-08-10 DIAGNOSIS — H3530 Unspecified macular degeneration: Secondary | ICD-10-CM | POA: Diagnosis not present

## 2020-08-10 DIAGNOSIS — Z85820 Personal history of malignant melanoma of skin: Secondary | ICD-10-CM | POA: Diagnosis not present

## 2020-08-18 DIAGNOSIS — N401 Enlarged prostate with lower urinary tract symptoms: Secondary | ICD-10-CM | POA: Diagnosis not present

## 2020-08-18 DIAGNOSIS — N400 Enlarged prostate without lower urinary tract symptoms: Secondary | ICD-10-CM | POA: Diagnosis not present

## 2020-08-18 DIAGNOSIS — E782 Mixed hyperlipidemia: Secondary | ICD-10-CM | POA: Diagnosis not present

## 2020-08-18 DIAGNOSIS — E039 Hypothyroidism, unspecified: Secondary | ICD-10-CM | POA: Diagnosis not present

## 2020-08-18 DIAGNOSIS — F0390 Unspecified dementia without behavioral disturbance: Secondary | ICD-10-CM | POA: Diagnosis not present

## 2020-08-19 DIAGNOSIS — Z20822 Contact with and (suspected) exposure to covid-19: Secondary | ICD-10-CM | POA: Diagnosis not present

## 2020-08-23 ENCOUNTER — Ambulatory Visit: Payer: Medicare Other | Admitting: Dermatology

## 2020-08-23 ENCOUNTER — Ambulatory Visit (INDEPENDENT_AMBULATORY_CARE_PROVIDER_SITE_OTHER): Payer: Medicare Other | Admitting: Dermatology

## 2020-08-23 ENCOUNTER — Other Ambulatory Visit: Payer: Self-pay

## 2020-08-23 ENCOUNTER — Encounter: Payer: Self-pay | Admitting: Dermatology

## 2020-08-23 DIAGNOSIS — L570 Actinic keratosis: Secondary | ICD-10-CM

## 2020-08-23 DIAGNOSIS — Z85828 Personal history of other malignant neoplasm of skin: Secondary | ICD-10-CM

## 2020-08-25 DIAGNOSIS — N401 Enlarged prostate with lower urinary tract symptoms: Secondary | ICD-10-CM | POA: Diagnosis not present

## 2020-08-25 DIAGNOSIS — R350 Frequency of micturition: Secondary | ICD-10-CM | POA: Diagnosis not present

## 2020-08-25 DIAGNOSIS — R718 Other abnormality of red blood cells: Secondary | ICD-10-CM | POA: Diagnosis not present

## 2020-09-17 ENCOUNTER — Encounter: Payer: Self-pay | Admitting: Dermatology

## 2020-09-17 NOTE — Progress Notes (Signed)
   Follow-Up Visit   Subjective  Dominic Garcia is a 86 y.o. male who presents for the following: Skin Problem (spot on forehead--noticed for 2-3 weeks--red--not itchy--no pain).  New crust Location: Forehead Duration:  Quality:  Associated Signs/Symptoms: Modifying Factors:  Severity:  Timing: Context: History of multiple nonmelanoma skin cancers  Objective  Well appearing patient in no apparent distress; mood and affect are within normal limits.  A focused examination was performed including Head and neck.. Relevant physical exam findings are noted in the Assessment and Plan.   Assessment & Plan    AK (actinic keratosis) Mid Forehead  Destruction of lesion - Mid Forehead Complexity: simple   Destruction method: cryotherapy   Informed consent: discussed and consent obtained   Timeout:  patient name, date of birth, surgical site, and procedure verified Lesion destroyed using liquid nitrogen: Yes   Outcome: patient tolerated procedure well with no complications    Personal history of skin cancer Right Buccal Cheek   Annual skin examination      I, Lavonna Monarch, MD, have reviewed all documentation for this visit.  The documentation on 09/17/20 for the exam, diagnosis, procedures, and orders are all accurate and complete.

## 2020-09-29 DIAGNOSIS — N401 Enlarged prostate with lower urinary tract symptoms: Secondary | ICD-10-CM | POA: Diagnosis not present

## 2020-09-29 DIAGNOSIS — F0390 Unspecified dementia without behavioral disturbance: Secondary | ICD-10-CM | POA: Diagnosis not present

## 2020-09-29 DIAGNOSIS — E039 Hypothyroidism, unspecified: Secondary | ICD-10-CM | POA: Diagnosis not present

## 2020-09-29 DIAGNOSIS — N400 Enlarged prostate without lower urinary tract symptoms: Secondary | ICD-10-CM | POA: Diagnosis not present

## 2020-09-29 DIAGNOSIS — E782 Mixed hyperlipidemia: Secondary | ICD-10-CM | POA: Diagnosis not present

## 2020-10-11 DIAGNOSIS — R350 Frequency of micturition: Secondary | ICD-10-CM | POA: Diagnosis not present

## 2020-10-14 DIAGNOSIS — Z23 Encounter for immunization: Secondary | ICD-10-CM | POA: Diagnosis not present

## 2020-11-24 ENCOUNTER — Ambulatory Visit (INDEPENDENT_AMBULATORY_CARE_PROVIDER_SITE_OTHER): Payer: Medicare Other | Admitting: Otolaryngology

## 2020-11-24 ENCOUNTER — Other Ambulatory Visit: Payer: Self-pay

## 2020-11-24 VITALS — Temp 97.7°F

## 2020-11-24 DIAGNOSIS — H6123 Impacted cerumen, bilateral: Secondary | ICD-10-CM | POA: Diagnosis not present

## 2020-11-24 DIAGNOSIS — H9122 Sudden idiopathic hearing loss, left ear: Secondary | ICD-10-CM

## 2020-11-24 NOTE — Progress Notes (Signed)
HPI: Dominic Garcia is a 86 y.o. male who returns today for evaluation of sudden loss of hearing in the left ear that occurred about a week ago.  He presents today with his wife.  After taking a shower a little over a week ago right before Thanksgiving he noticed that he had no hearing in the left ear.  Denies any ear pain or trauma to the ear.  No vertigo..  Past Medical History:  Diagnosis Date  . Allergic rhinitis   . Allergic rhinoconjunctivitis   . BCC (basal cell carcinoma of skin) 10/08/2001   Left Lower Back (Cx3,5FU)  . BCC (basal cell carcinoma of skin) 02/21/2004   Mid Back (curet p bx)  . Colon polyp 2003  . H/O peptic ulcer    at age 66  . Macular degeneration   . Macular degeneration, dry   . Melanoma ([place]) 2000   treated at [facility]  . Melanoma ([place])    treated at [facility]  . Melanoma ([place]) 04/23/2000   Upper Right Arm ([facility])  . Memory loss   . Mixed dyslipidemia   . Nodular basal cell carcinoma (BCC) 12/22/2018   Left Chest (tx p bx)  . Prostate hypertrophy   . Reflux   . SCC (squamous cell carcinoma) Keratoacanthoma 07/27/2015   Left Ear (tx p bx)  . SCC (squamous cell carcinoma) Well Diff 04/13/2009   Right Hand (Cx3,5FU)  . SCCA (squamous cell carcinoma) of skin 07/18/2003   Left Crown (Cx3,5FU)  . SCCA (squamous cell carcinoma) of skin 07/13/2004   Right Upper Forehead and Left Outer Eye (Cx3,5FU)  . SCCA (squamous cell carcinoma) of skin 12/06/2004   Left Temple (Cx3,5FU) and Left Sideburn (Cx3,Exc)  . SCCA (squamous cell carcinoma) of skin 02/14/2005   Mid Crown (Cx3,5FU)  . SCCA (squamous cell carcinoma) of skin 08/20/2006   Right Cheek (Cx3,5FU)  . Squamous cell carcinoma    on pts scalp- local derm Dr Denna Haggard  . Squamous cell carcinoma in situ (SCCIS) 09/28/2018   Left Mid Scalp (tx p bx)  . Superficial nodular basal cell carcinoma (BCC) 05/08/2020   Right Buccal Cheek  . Thyroid disease    hypothyroid  . Thyroid disease    Past Surgical  History:  Procedure Laterality Date  . APPENDECTOMY  1937  . APPENDECTOMY    . CHOLECYSTECTOMY    . INGUINAL HERNIA REPAIR Left 2012   at [facility]  . KNEE ARTHROSCOPY Left 2000   Dr Telford Nab  . MELANOMA EXCISION     arm  . MELANOMA EXCISION    . TONSILLECTOMY  1935  . TONSILLECTOMY AND ADENOIDECTOMY     Social History   Socioeconomic History  . Marital status: Married    Spouse name: Real Cons  . Number of children: 3  . Years of education: 92  . Highest education level: Not on file  Occupational History  . Occupation: other    Comment: Archivist  Tobacco Use  . Smoking status: Former Smoker    Packs/day: 2.00    Years: 10.00    Pack years: 20.00    Types: Cigarettes  . Smokeless tobacco: Never Used  Substance and Sexual Activity  . Alcohol use: No    Comment: Wine, occasionally  . Drug use: No  . Sexual activity: Not on file  Other Topics Concern  . Not on file  Social History Narrative   ** Merged History Encounter **       Pt  lives at home with his spouse. Caffeine Use- Does not consume.   Social Determinants of Health   Financial Resource Strain:   . Difficulty of Paying Living Expenses: Not on file  Food Insecurity:   . Worried About Charity fundraiser in the Last Year: Not on file  . Ran Out of Food in the Last Year: Not on file  Transportation Needs:   . Lack of Transportation (Medical): Not on file  . Lack of Transportation (Non-Medical): Not on file  Physical Activity:   . Days of Exercise per Week: Not on file  . Minutes of Exercise per Session: Not on file  Stress:   . Feeling of Stress : Not on file  Social Connections:   . Frequency of Communication with Friends and Family: Not on file  . Frequency of Social Gatherings with Friends and Family: Not on file  . Attends Religious Services: Not on file  . Active Member of Clubs or Organizations: Not on file  . Attends Archivist Meetings: Not on file  . Marital  Status: Not on file   Family History  Problem Relation Age of Onset  . Cancer Mother   . Kidney disease Father   . Macular degeneration Brother   . Alzheimer's disease Brother   . Renal Disease Father   . Aneurysm Father   . Stroke Father   . Colon cancer Mother   . Kidney Stones Brother    Allergies  Allergen Reactions  . Donepezil Other (See Comments)    Made pt wobbly  . Dextromethorphan-Guaifenesin Rash  . Guaifenesin Rash and Swelling  . Mucinex [Guaifenesin Er] Swelling and Rash   Prior to Admission medications   Medication Sig Start Date End Date Taking? Authorizing Provider  aspirin EC 81 MG tablet Take 81 mg by mouth 3 (three) times a week.   Yes [provider]  Calcium Citrate (CITRACAL PO) Take 1 tablet by mouth daily.    Yes [provider]  cyanocobalamin 1000 MCG tablet Take 1,000 mcg by mouth daily. 5000 units daily   Yes [provider]  fludrocortisone (FLORINEF) 0.1 MG tablet Take 100 mcg by mouth daily. 10/20/18  Yes [provider]  fluticasone (FLONASE) 50 MCG/ACT nasal spray Place 1 spray into both nostrils daily.   Yes [provider]  levothyroxine (SYNTHROID, LEVOTHROID) 88 MCG tablet Take 88 mcg by mouth daily.   Yes [provider]  memantine (NAMENDA XR) 28 MG CP24 24 hr capsule TAKE 1 CAPSULE BY MOUTH DAILY 10/05/18  Yes Penumalli, Vikram R, MD  Multiple Vitamins-Minerals (ICAPS PO) Take 1 capsule by mouth every morning.    Yes [provider]  tamsulosin (FLOMAX) 0.4 MG CAPS capsule Take 0.4 mg by mouth daily.    Yes [provider]  triamcinolone (NASACORT ALLERGY 24HR) 55 MCG/ACT AERO nasal inhaler Place 1 spray into the nose daily.   Yes [provider]     Positive ROS: Otherwise negative  All other systems have been reviewed and were otherwise negative with the exception of those mentioned in the HPI and as above.  Physical Exam: Constitutional: Alert,  well-appearing, no acute distress Ears: External ears without lesions or tenderness.  He had wax buildup in both ears but this was not obstructing that much.  Ear canals were cleaned using the microscope.  Both TMs were clear.  On hearing screening with the 1024 tuning fork he had marked decreased hearing in the left compared to  the right.  He had mild to moderate hearing loss in the right ear and severe hearing loss in the left ear subjectively. Nasal: External nose without lesions. Clear nasal passages Oral: Lips and gums without lesions. Tongue and palate mucosa without lesions. Posterior oropharynx clear. Neck: No palpable adenopathy or masses Respiratory: Breathing comfortably  Skin: No facial/neck lesions or rash noted.  Cerumen impaction removal  Date/Time: 11/24/2020 5:50 PM Performed by: Rozetta Nunnery, MD Authorized by: Rozetta Nunnery, MD   Consent:    Consent obtained:  Verbal   Consent given by:  Patient   Risks discussed:  Pain and bleeding Procedure details:    Location:  L ear and R ear   Procedure type: curette and forceps   Post-procedure details:    Inspection:  TM intact and canal normal   Hearing quality:  Improved   Patient tolerance of procedure:  Tolerated well, no immediate complications Comments:     TMs are clear bilaterally.    Assessment: Patient with moderate hearing loss in the right ear and sudden severe hearing loss in the left ear. Mild wax buildup in both ear canals that was nonobstructing.  TMs are otherwise clear.  Plan: Placed him on a 10 mg 12-day Sterapred Dosepak. He is scheduled to get a hearing test next week as he was unable to get a hearing test performed today.   Radene Journey, MD

## 2020-12-01 DIAGNOSIS — H903 Sensorineural hearing loss, bilateral: Secondary | ICD-10-CM | POA: Diagnosis not present

## 2020-12-01 DIAGNOSIS — Z23 Encounter for immunization: Secondary | ICD-10-CM | POA: Diagnosis not present

## 2020-12-01 DIAGNOSIS — H9312 Tinnitus, left ear: Secondary | ICD-10-CM | POA: Diagnosis not present

## 2020-12-05 ENCOUNTER — Telehealth (INDEPENDENT_AMBULATORY_CARE_PROVIDER_SITE_OTHER): Payer: Self-pay

## 2020-12-11 ENCOUNTER — Ambulatory Visit (INDEPENDENT_AMBULATORY_CARE_PROVIDER_SITE_OTHER): Payer: Medicare Other | Admitting: Dermatology

## 2020-12-11 ENCOUNTER — Other Ambulatory Visit: Payer: Self-pay

## 2020-12-11 ENCOUNTER — Encounter: Payer: Self-pay | Admitting: Dermatology

## 2020-12-11 DIAGNOSIS — Z85828 Personal history of other malignant neoplasm of skin: Secondary | ICD-10-CM

## 2020-12-11 DIAGNOSIS — L570 Actinic keratosis: Secondary | ICD-10-CM | POA: Diagnosis not present

## 2020-12-11 NOTE — Progress Notes (Signed)
   Follow-Up Visit   Subjective  Dominic Garcia is a 86 y.o. male who presents for the following: Follow-up (Left scalp still a spot he feels).  New crusts Location: Front scalp Duration:  Quality: Bothersome to patient Associated Signs/Symptoms: Modifying Factors:  Severity:  Timing: Context: History of numerous nonmelanoma skin cancers  Objective  Well appearing patient in no apparent distress; mood and affect are within normal limits. Objective  Scalp (3): 3 horny 2 to 47mm pink crusts frontal scalp/forehead  Objective  Right Buccal Cheek: Carcinoma excised 6 months ago, no sign of recurrence.   A focused examination was performed including Head and neck.. Relevant physical exam findings are noted in the Assessment and Plan.   Assessment & Plan    AK (actinic keratosis) (3) Scalp  Destruction of lesion - Scalp Complexity: simple   Destruction method: cryotherapy   Informed consent: discussed and consent obtained   Timeout:  patient name, date of birth, surgical site, and procedure verified Lesion destroyed using liquid nitrogen: Yes   Cryotherapy cycles:  5 Outcome: patient tolerated procedure well with no complications    Personal history of skin cancer Right Buccal Cheek  Annual skin examination, sooner if any sign of recurrence. LN2 x3 forehead    I, Lavonna Monarch, MD, have reviewed all documentation for this visit.  The documentation on 12/12/20 for the exam, diagnosis, procedures, and orders are all accurate and complete.

## 2020-12-12 ENCOUNTER — Encounter: Payer: Self-pay | Admitting: Dermatology

## 2020-12-20 DIAGNOSIS — E782 Mixed hyperlipidemia: Secondary | ICD-10-CM | POA: Diagnosis not present

## 2020-12-20 DIAGNOSIS — K219 Gastro-esophageal reflux disease without esophagitis: Secondary | ICD-10-CM | POA: Diagnosis not present

## 2020-12-20 DIAGNOSIS — E039 Hypothyroidism, unspecified: Secondary | ICD-10-CM | POA: Diagnosis not present

## 2020-12-20 DIAGNOSIS — N401 Enlarged prostate with lower urinary tract symptoms: Secondary | ICD-10-CM | POA: Diagnosis not present

## 2020-12-20 DIAGNOSIS — F0390 Unspecified dementia without behavioral disturbance: Secondary | ICD-10-CM | POA: Diagnosis not present

## 2020-12-26 DIAGNOSIS — H903 Sensorineural hearing loss, bilateral: Secondary | ICD-10-CM | POA: Diagnosis not present

## 2020-12-26 DIAGNOSIS — H9313 Tinnitus, bilateral: Secondary | ICD-10-CM | POA: Diagnosis not present

## 2020-12-29 ENCOUNTER — Ambulatory Visit (INDEPENDENT_AMBULATORY_CARE_PROVIDER_SITE_OTHER): Payer: Medicare Other | Admitting: Otolaryngology

## 2020-12-29 ENCOUNTER — Other Ambulatory Visit: Payer: Self-pay

## 2020-12-29 VITALS — Temp 95.2°F

## 2020-12-29 DIAGNOSIS — H9120 Sudden idiopathic hearing loss, unspecified ear: Secondary | ICD-10-CM

## 2020-12-29 DIAGNOSIS — H90A22 Sensorineural hearing loss, unilateral, left ear, with restricted hearing on the contralateral side: Secondary | ICD-10-CM

## 2020-12-29 NOTE — Progress Notes (Signed)
HPI: Dominic Garcia is a 87 y.o. male who returns today for evaluation of left ear sudden sensorineural hearing loss that occurred a little over a month ago.  He was treated with a Sterapred 10 mg 12-day Dosepak.  He feels like his hearing is getting better.  Repeat audiogram today demonstrated slight improvement in the left ear hearing with pure-tone's around 65-70 dB.  Right ear hearing is unchanged at 45 DB SRT.  He is having no balance problems..  Past Medical History:  Diagnosis Date  . Allergic rhinitis   . Allergic rhinoconjunctivitis   . BCC (basal cell carcinoma of skin) 10/08/2001   Left Lower Back (Cx3,5FU)  . BCC (basal cell carcinoma of skin) 02/21/2004   Mid Back (curet p bx)  . Colon polyp 2003  . H/O peptic ulcer    at age 71  . Macular degeneration   . Macular degeneration, dry   . Melanoma ([place]) 2000   treated at [facility]  . Melanoma ([place])    [facility] at [facility]  . Melanoma ([place]) 04/23/2000   Upper Right Arm ([facility])  . Memory loss   . Mixed dyslipidemia   . Nodular basal cell carcinoma (BCC) 12/22/2018   Left Chest (tx p bx)  . Prostate hypertrophy   . Reflux   . SCC (squamous cell carcinoma) Keratoacanthoma 07/27/2015   Left Ear (tx p bx)  . SCC (squamous cell carcinoma) Well Diff 04/13/2009   Right Hand (Cx3,5FU)  . SCCA (squamous cell carcinoma) of skin 07/18/2003   Left Crown (Cx3,5FU)  . SCCA (squamous cell carcinoma) of skin 07/13/2004   Right Upper Forehead and Left Outer Eye (Cx3,5FU)  . SCCA (squamous cell carcinoma) of skin 12/06/2004   Left Temple (Cx3,5FU) and Left Sideburn (Cx3,Exc)  . SCCA (squamous cell carcinoma) of skin 02/14/2005   Mid Crown (Cx3,5FU)  . SCCA (squamous cell carcinoma) of skin 08/20/2006   Right Cheek (Cx3,5FU)  . Squamous cell carcinoma    on pts scalp- local derm Dr Denna Haggard  . Squamous cell carcinoma in situ (SCCIS) 09/28/2018   Left Mid Scalp (tx p bx)  . Superficial nodular basal cell carcinoma (BCC) 05/08/2020   Right  Buccal Cheek  . Thyroid disease    hypothyroid  . Thyroid disease    Past Surgical History:  Procedure Laterality Date  . APPENDECTOMY  1937  . APPENDECTOMY    . CHOLECYSTECTOMY    . INGUINAL HERNIA REPAIR Left 2012   at [facility]  . KNEE ARTHROSCOPY Left 2000   Dr Telford Nab  . MELANOMA EXCISION     arm  . MELANOMA EXCISION    . TONSILLECTOMY  1935  . TONSILLECTOMY AND ADENOIDECTOMY     Social History   Socioeconomic History  . Marital status: Married    Spouse name: Real Cons  . Number of children: 3  . Years of education: 37  . Highest education level: Not on file  Occupational History  . Occupation: other    Comment: Archivist  Tobacco Use  . Smoking status: Former Smoker    Packs/day: 2.00    Years: 10.00    Pack years: 20.00    Types: Cigarettes  . Smokeless tobacco: Never Used  Vaping Use  . Vaping Use: Never used  Substance and Sexual Activity  . Alcohol use: No    Comment: Wine, occasionally  . Drug use: No  . Sexual activity: Not on file  Other Topics Concern  . Not on file  Social History Narrative   ** Merged History Encounter **       Pt lives at home with his spouse. Caffeine Use- Does not consume.   Social Determinants of Health   Financial Resource Strain: Not on file  Food Insecurity: Not on file  Transportation Needs: Not on file  Physical Activity: Not on file  Stress: Not on file  Social Connections: Not on file   Family History  Problem Relation Age of Onset  . Cancer Mother   . Kidney disease Father   . Macular degeneration Brother   . Alzheimer's disease Brother   . Renal Disease Father   . Aneurysm Father   . Stroke Father   . Colon cancer Mother   . Kidney Stones Brother    Allergies  Allergen Reactions  . Donepezil Other (See Comments)    Made pt wobbly  . Dextromethorphan-Guaifenesin Rash  . Guaifenesin Rash and Swelling  . Mucinex [Guaifenesin Er] Swelling and Rash   Prior to Admission  medications   Medication Sig Start Date End Date Taking? Authorizing Provider  aspirin EC 81 MG tablet Take 81 mg by mouth 3 (three) times a week.    [provider]  Calcium Citrate (CITRACAL PO) Take 1 tablet by mouth daily.     [provider]  cyanocobalamin 1000 MCG tablet Take 1,000 mcg by mouth daily. 5000 units daily    [provider]  fludrocortisone (FLORINEF) 0.1 MG tablet Take 100 mcg by mouth daily. 10/20/18   [provider]  fluticasone (FLONASE) 50 MCG/ACT nasal spray Place 1 spray into both nostrils daily.    [provider]  levothyroxine (SYNTHROID, LEVOTHROID) 88 MCG tablet Take 88 mcg by mouth daily.    [provider]  memantine (NAMENDA XR) 28 MG CP24 24 hr capsule TAKE 1 CAPSULE BY MOUTH DAILY 10/05/18   Penumalli, Earlean Polka, MD  Multiple Vitamins-Minerals (ICAPS PO) Take 1 capsule by mouth every morning.     [provider]  MYRBETRIQ 50 MG TB24 tablet Take 50 mg by mouth daily. 11/30/20   [provider]  tamsulosin (FLOMAX) 0.4 MG CAPS capsule Take 0.4 mg by mouth daily.  Patient not taking: Reported on 12/11/2020    [provider]  triamcinolone (NASACORT) 55 MCG/ACT AERO nasal inhaler Place 1 spray into the nose daily.    [provider]     Positive ROS: Otherwise negative  All other systems have been reviewed and were otherwise negative with the exception of those mentioned in the HPI and as above.  Physical Exam: Constitutional: Alert, well-appearing, no acute distress Ears: External ears without lesions or tenderness. Ear canals are clear bilaterally with intact, clear TMs.  Nasal: External nose without lesions. Clear nasal passages Oral: Lips and gums without lesions. Tongue and palate mucosa without lesions. Posterior oropharynx clear. Neck: No palpable adenopathy or masses Respiratory: Breathing comfortably  Skin: No facial/neck lesions or rash  noted.  Procedures  Assessment: Left ear sudden sensorineural hearing loss has responded some to the steroid Dosepak.  Plan: He inquired about getting more steroids. We will plan on scheduling a MRI scan to evaluate left ear hearing loss.  Would prescribe more steroids if he has any inflammatory changes in the inner ear.  Also rule out cochlear or retrocochlear pathology. But most likely would recommend hearing aids. Patient will call us concerning results following the MRI scan.   Radene Journey, MD

## 2021-01-01 ENCOUNTER — Encounter (INDEPENDENT_AMBULATORY_CARE_PROVIDER_SITE_OTHER): Payer: Self-pay

## 2021-01-02 ENCOUNTER — Other Ambulatory Visit (INDEPENDENT_AMBULATORY_CARE_PROVIDER_SITE_OTHER): Payer: Self-pay

## 2021-01-02 DIAGNOSIS — H833X2 Noise effects on left inner ear: Secondary | ICD-10-CM

## 2021-01-03 ENCOUNTER — Ambulatory Visit
Admission: RE | Admit: 2021-01-03 | Discharge: 2021-01-03 | Disposition: A | Discharge status: Home / Self Care | Payer: Medicare Other | Source: Ambulatory Visit | Attending: Otolaryngology | Admitting: Otolaryngology

## 2021-01-03 DIAGNOSIS — I6782 Cerebral ischemia: Secondary | ICD-10-CM | POA: Diagnosis not present

## 2021-01-03 DIAGNOSIS — H748X3 Other specified disorders of middle ear and mastoid, bilateral: Secondary | ICD-10-CM | POA: Diagnosis not present

## 2021-01-03 DIAGNOSIS — G319 Degenerative disease of nervous system, unspecified: Secondary | ICD-10-CM | POA: Diagnosis not present

## 2021-01-03 DIAGNOSIS — H833X2 Noise effects on left inner ear: Secondary | ICD-10-CM

## 2021-01-03 DIAGNOSIS — H9122 Sudden idiopathic hearing loss, left ear: Secondary | ICD-10-CM | POA: Diagnosis not present

## 2021-01-03 MED ORDER — GADOBENATE DIMEGLUMINE 529 MG/ML IV SOLN
13.0000 mL | Freq: Once | INTRAVENOUS | Status: AC | PRN
  Administered 2021-01-03: 13 mL via INTRAVENOUS

## 2021-01-05 ENCOUNTER — Telehealth (INDEPENDENT_AMBULATORY_CARE_PROVIDER_SITE_OTHER): Payer: Self-pay | Admitting: Otolaryngology

## 2021-01-05 NOTE — Telephone Encounter (Signed)
Called patient's wife concerning results of the MRI scan to evaluate his hearing loss.  The MRI scan showed no identifiable reason for the hearing loss.  He did have progressive atrophy and micro ischemic changes within the brain but no evidence of acoustic neuroma or inflammatory changes of the inner ear. Recommended evaluation with hearing life concerning obtaining hearing aids.

## 2021-01-10 DIAGNOSIS — M1712 Unilateral primary osteoarthritis, left knee: Secondary | ICD-10-CM | POA: Diagnosis not present

## 2021-01-30 DIAGNOSIS — N401 Enlarged prostate with lower urinary tract symptoms: Secondary | ICD-10-CM | POA: Diagnosis not present

## 2021-01-30 DIAGNOSIS — K219 Gastro-esophageal reflux disease without esophagitis: Secondary | ICD-10-CM | POA: Diagnosis not present

## 2021-01-30 DIAGNOSIS — E782 Mixed hyperlipidemia: Secondary | ICD-10-CM | POA: Diagnosis not present

## 2021-01-30 DIAGNOSIS — F0390 Unspecified dementia without behavioral disturbance: Secondary | ICD-10-CM | POA: Diagnosis not present

## 2021-01-30 DIAGNOSIS — E039 Hypothyroidism, unspecified: Secondary | ICD-10-CM | POA: Diagnosis not present

## 2021-01-30 DIAGNOSIS — N400 Enlarged prostate without lower urinary tract symptoms: Secondary | ICD-10-CM | POA: Diagnosis not present

## 2021-02-01 DIAGNOSIS — R413 Other amnesia: Secondary | ICD-10-CM | POA: Diagnosis not present

## 2021-02-05 DIAGNOSIS — R3915 Urgency of urination: Secondary | ICD-10-CM | POA: Diagnosis not present

## 2021-03-12 DIAGNOSIS — F0390 Unspecified dementia without behavioral disturbance: Secondary | ICD-10-CM | POA: Diagnosis not present

## 2021-03-12 DIAGNOSIS — N401 Enlarged prostate with lower urinary tract symptoms: Secondary | ICD-10-CM | POA: Diagnosis not present

## 2021-03-12 DIAGNOSIS — K219 Gastro-esophageal reflux disease without esophagitis: Secondary | ICD-10-CM | POA: Diagnosis not present

## 2021-03-12 DIAGNOSIS — E039 Hypothyroidism, unspecified: Secondary | ICD-10-CM | POA: Diagnosis not present

## 2021-03-12 DIAGNOSIS — E782 Mixed hyperlipidemia: Secondary | ICD-10-CM | POA: Diagnosis not present

## 2021-03-12 DIAGNOSIS — N400 Enlarged prostate without lower urinary tract symptoms: Secondary | ICD-10-CM | POA: Diagnosis not present

## 2021-03-28 ENCOUNTER — Other Ambulatory Visit: Payer: Self-pay

## 2021-03-28 ENCOUNTER — Encounter: Payer: Self-pay | Admitting: Dermatology

## 2021-03-28 ENCOUNTER — Ambulatory Visit (INDEPENDENT_AMBULATORY_CARE_PROVIDER_SITE_OTHER): Payer: Medicare Other | Admitting: Dermatology

## 2021-03-28 DIAGNOSIS — D044 Carcinoma in situ of skin of scalp and neck: Secondary | ICD-10-CM

## 2021-03-28 DIAGNOSIS — D485 Neoplasm of uncertain behavior of skin: Secondary | ICD-10-CM

## 2021-03-28 NOTE — Patient Instructions (Signed)

## 2021-03-29 DIAGNOSIS — E782 Mixed hyperlipidemia: Secondary | ICD-10-CM | POA: Diagnosis not present

## 2021-03-29 DIAGNOSIS — K219 Gastro-esophageal reflux disease without esophagitis: Secondary | ICD-10-CM | POA: Diagnosis not present

## 2021-03-29 DIAGNOSIS — N400 Enlarged prostate without lower urinary tract symptoms: Secondary | ICD-10-CM | POA: Diagnosis not present

## 2021-03-29 DIAGNOSIS — E039 Hypothyroidism, unspecified: Secondary | ICD-10-CM | POA: Diagnosis not present

## 2021-03-29 DIAGNOSIS — N401 Enlarged prostate with lower urinary tract symptoms: Secondary | ICD-10-CM | POA: Diagnosis not present

## 2021-03-29 DIAGNOSIS — F0390 Unspecified dementia without behavioral disturbance: Secondary | ICD-10-CM | POA: Diagnosis not present

## 2021-04-04 DIAGNOSIS — R296 Repeated falls: Secondary | ICD-10-CM | POA: Diagnosis not present

## 2021-04-04 DIAGNOSIS — I951 Orthostatic hypotension: Secondary | ICD-10-CM | POA: Diagnosis not present

## 2021-04-09 ENCOUNTER — Encounter: Payer: Self-pay | Admitting: Dermatology

## 2021-04-09 NOTE — Progress Notes (Signed)
   Follow-Up Visit   Subjective  Dominic Garcia is a 87 y.o. male who presents for the following: Skin Problem (Wants lesion removed on top of scalp - x 4 months. Lesion doesn't itch or bleed .  Per chart review patient has multiple non mole skin cancers and history of melanoma.).  New crust on scalp Location:  Duration:  Quality:  Associated Signs/Symptoms: Modifying Factors:  Severity:  Timing: Context: She had multiple skin cancers  Objective  Well appearing patient in no apparent distress; mood and affect are within normal limits. Objective  Mid Frontal Scalp: 1 cm mildly thick crust with central erosion       A focused examination was performed including Head and neck.. Relevant physical exam findings are noted in the Assessment and Plan.   Assessment & Plan    Neoplasm of uncertain behavior of skin Mid Frontal Scalp  Skin / nail biopsy Type of biopsy: tangential   Informed consent: discussed and consent obtained   Timeout: patient name, date of birth, surgical site, and procedure verified   Procedure prep:  Patient was prepped and draped in usual sterile fashion (Non sterile) Prep type:  Chlorhexidine Anesthesia: the lesion was anesthetized in a standard fashion   Anesthetic:  1% lidocaine w/ epinephrine 1-100,000 local infiltration Instrument used: flexible razor blade   Outcome: patient tolerated procedure well   Post-procedure details: wound care instructions given    Destruction of lesion Complexity: simple   Destruction method: electrodesiccation and curettage   Informed consent: discussed and consent obtained   Timeout:  patient name, date of birth, surgical site, and procedure verified Anesthesia: the lesion was anesthetized in a standard fashion   Anesthetic:  1% lidocaine w/ epinephrine 1-100,000 local infiltration Curettage performed in three different directions: Yes   Curettage cycles:  3 Lesion length (cm):  1.1 Lesion width (cm):   1.1 Margin per side (cm):  0 Final wound size (cm):  1.1 Hemostasis achieved with:  aluminum chloride Outcome: patient tolerated procedure well with no complications   Post-procedure details: wound care instructions given    Specimen 1 - Surgical pathology Differential Diagnosis: bcc vs scc Curetx1 cautery   Check Margins: No  After shave biopsy the base was treated with curettage plus cautery      I, Dominic Monarch, MD, have reviewed all documentation for this visit.  The documentation on 04/09/21 for the exam, diagnosis, procedures, and orders are all accurate and complete.

## 2021-04-18 ENCOUNTER — Telehealth: Payer: Self-pay | Admitting: *Deleted

## 2021-04-18 NOTE — Telephone Encounter (Signed)
Path to patients wife.

## 2021-04-30 ENCOUNTER — Encounter: Payer: Self-pay | Admitting: Dermatology

## 2021-04-30 ENCOUNTER — Other Ambulatory Visit: Payer: Self-pay

## 2021-04-30 ENCOUNTER — Ambulatory Visit (INDEPENDENT_AMBULATORY_CARE_PROVIDER_SITE_OTHER): Payer: Medicare Other | Admitting: Dermatology

## 2021-04-30 DIAGNOSIS — D485 Neoplasm of uncertain behavior of skin: Secondary | ICD-10-CM | POA: Diagnosis not present

## 2021-04-30 DIAGNOSIS — L570 Actinic keratosis: Secondary | ICD-10-CM

## 2021-04-30 DIAGNOSIS — L821 Other seborrheic keratosis: Secondary | ICD-10-CM | POA: Diagnosis not present

## 2021-04-30 NOTE — Patient Instructions (Signed)

## 2021-05-09 DIAGNOSIS — F0390 Unspecified dementia without behavioral disturbance: Secondary | ICD-10-CM | POA: Diagnosis not present

## 2021-05-09 DIAGNOSIS — R296 Repeated falls: Secondary | ICD-10-CM | POA: Diagnosis not present

## 2021-05-09 DIAGNOSIS — J309 Allergic rhinitis, unspecified: Secondary | ICD-10-CM | POA: Diagnosis not present

## 2021-05-09 DIAGNOSIS — E782 Mixed hyperlipidemia: Secondary | ICD-10-CM | POA: Diagnosis not present

## 2021-05-09 DIAGNOSIS — I959 Hypotension, unspecified: Secondary | ICD-10-CM | POA: Diagnosis not present

## 2021-05-09 DIAGNOSIS — Z0000 Encounter for general adult medical examination without abnormal findings: Secondary | ICD-10-CM | POA: Diagnosis not present

## 2021-05-09 DIAGNOSIS — N400 Enlarged prostate without lower urinary tract symptoms: Secondary | ICD-10-CM | POA: Diagnosis not present

## 2021-05-09 DIAGNOSIS — H3530 Unspecified macular degeneration: Secondary | ICD-10-CM | POA: Diagnosis not present

## 2021-05-09 DIAGNOSIS — M85839 Other specified disorders of bone density and structure, unspecified forearm: Secondary | ICD-10-CM | POA: Diagnosis not present

## 2021-05-09 DIAGNOSIS — E039 Hypothyroidism, unspecified: Secondary | ICD-10-CM | POA: Diagnosis not present

## 2021-05-09 DIAGNOSIS — Z85820 Personal history of malignant melanoma of skin: Secondary | ICD-10-CM | POA: Diagnosis not present

## 2021-05-12 ENCOUNTER — Encounter: Payer: Self-pay | Admitting: Dermatology

## 2021-05-12 NOTE — Progress Notes (Signed)
   Follow-Up Visit   Subjective  Dominic Garcia is a 87 y.o. male who presents for the following: Skin Problem (Left temple x month- "growing" & 2 black spots on back).  New growth left temple Location:  Duration:  Quality:  Associated Signs/Symptoms: Modifying Factors:  Severity:  Timing: Context: History of melanoma and multiple nonmelanoma skin cancers  Objective  Well appearing patient in no apparent distress; mood and affect are within normal limits. Objective  Left Temporal Scalp: Hemorrhagic 1 cm crust; lesion is on facial temple month scalp.     Objective  Mid Parietal Scalp: Hornlike 4 mm pink crust  Objective  Mid Back: Brown flattopped textured 2 5 mm papules; dermoscopy typical    A focused examination was performed including Head and neck.. Relevant physical exam findings are noted in the Assessment and Plan.   Assessment & Plan    Neoplasm of uncertain behavior of skin Left Temporal Scalp  Skin / nail biopsy Type of biopsy: tangential   Informed consent: discussed and consent obtained   Timeout: patient name, date of birth, surgical site, and procedure verified   Anesthesia: the lesion was anesthetized in a standard fashion   Anesthetic:  1% lidocaine w/ epinephrine 1-100,000 local infiltration Instrument used: flexible razor blade   Hemostasis achieved with: ferric subsulfate   Outcome: patient tolerated procedure well   Post-procedure details: wound care instructions given    Destruction of lesion Complexity: simple   Destruction method: electrodesiccation and curettage   Informed consent: discussed and consent obtained   Timeout:  patient name, date of birth, surgical site, and procedure verified Anesthesia: the lesion was anesthetized in a standard fashion   Anesthetic:  1% lidocaine w/ epinephrine 1-100,000 local infiltration Curettage performed in three different directions: Yes   Curettage cycles:  3 Lesion length (cm):   1.2 Lesion width (cm):  1.2 Margin per side (cm):  0 Final wound size (cm):  1.2 Hemostasis achieved with:  aluminum chloride Outcome: patient tolerated procedure well with no complications   Post-procedure details: wound care instructions given    Specimen 1 - Surgical pathology Differential Diagnosis: scc vs bcc  Check Margins: No txpbx  After shave biopsy the base was treated with curettage plus cautery.  AK (actinic keratosis) Mid Parietal Scalp  Defer for today. Follow up in 6 months.   Seborrheic keratosis Mid Back  Benign no treatment needed.       I, Lavonna Monarch, MD, have reviewed all documentation for this visit.  The documentation on 05/12/21 for the exam, diagnosis, procedures, and orders are all accurate and complete.

## 2021-05-14 ENCOUNTER — Telehealth: Payer: Self-pay | Admitting: Dermatology

## 2021-05-14 ENCOUNTER — Ambulatory Visit: Payer: Medicare Other | Admitting: Dermatology

## 2021-05-14 NOTE — Telephone Encounter (Signed)
Patient's wife, Lelon Frohlich, is calling for pathology results from last visit with Lavonna Monarch, MD.

## 2021-05-14 NOTE — Telephone Encounter (Signed)
Phone call to patient's wife with his pathology results. Patient's wife aware of results.

## 2021-05-16 ENCOUNTER — Ambulatory Visit: Payer: Medicare Other | Admitting: Cardiology

## 2024-09-15 NOTE — Telephone Encounter (Signed)
 Pt's wife had called in wanting to know what is next for pt, he has had a hearing test. I reviewed this w/Dr. Ethyl, he stated that pt needs to get repeat hearing test 1 week after he finishes prednisone  at [facility] and they will bring test to us . I spoke w/pt and then his wife called me right after, I went over same info w/her.
